# Patient Record
Sex: Female | Born: 1975 | Hispanic: No | Marital: Single | Smoking: Never smoker
Health system: Southern US, Community
[De-identification: ages and names within clinical notes are randomized; demographics above are authoritative.]

## PROBLEM LIST (undated history)

## (undated) DIAGNOSIS — J45909 Unspecified asthma, uncomplicated: Secondary | ICD-10-CM

## (undated) DIAGNOSIS — R531 Weakness: Secondary | ICD-10-CM

## (undated) DIAGNOSIS — R109 Unspecified abdominal pain: Secondary | ICD-10-CM

## (undated) DIAGNOSIS — M79604 Pain in right leg: Secondary | ICD-10-CM

## (undated) DIAGNOSIS — E559 Vitamin D deficiency, unspecified: Secondary | ICD-10-CM

## (undated) DIAGNOSIS — R5383 Other fatigue: Secondary | ICD-10-CM

## (undated) DIAGNOSIS — D649 Anemia, unspecified: Secondary | ICD-10-CM

## (undated) DIAGNOSIS — N946 Dysmenorrhea, unspecified: Secondary | ICD-10-CM

## (undated) DIAGNOSIS — E7800 Pure hypercholesterolemia, unspecified: Secondary | ICD-10-CM

## (undated) DIAGNOSIS — K219 Gastro-esophageal reflux disease without esophagitis: Secondary | ICD-10-CM

## (undated) DIAGNOSIS — R112 Nausea with vomiting, unspecified: Secondary | ICD-10-CM

## (undated) DIAGNOSIS — E782 Mixed hyperlipidemia: Secondary | ICD-10-CM

## (undated) DIAGNOSIS — M791 Myalgia, unspecified site: Secondary | ICD-10-CM

## (undated) DIAGNOSIS — I499 Cardiac arrhythmia, unspecified: Secondary | ICD-10-CM

## (undated) DIAGNOSIS — R0602 Shortness of breath: Secondary | ICD-10-CM

## (undated) DIAGNOSIS — K625 Hemorrhage of anus and rectum: Secondary | ICD-10-CM

## (undated) DIAGNOSIS — Z72820 Sleep deprivation: Secondary | ICD-10-CM

## (undated) DIAGNOSIS — R52 Pain, unspecified: Secondary | ICD-10-CM

## (undated) DIAGNOSIS — G43909 Migraine, unspecified, not intractable, without status migrainosus: Secondary | ICD-10-CM

## (undated) DIAGNOSIS — R4589 Other symptoms and signs involving emotional state: Secondary | ICD-10-CM

## (undated) DIAGNOSIS — R194 Change in bowel habit: Secondary | ICD-10-CM

## (undated) DIAGNOSIS — J301 Allergic rhinitis due to pollen: Secondary | ICD-10-CM

## (undated) HISTORY — DX: Sleep deprivation: Z72.820

## (undated) HISTORY — DX: Dysmenorrhea, unspecified: N94.6

## (undated) HISTORY — PX: BUNIONECTOMY: SHX129

## (undated) HISTORY — DX: Pure hypercholesterolemia, unspecified: E78.00

## (undated) HISTORY — DX: Gastro-esophageal reflux disease without esophagitis: K21.9

## (undated) HISTORY — DX: Change in bowel habit: R19.4

## (undated) HISTORY — DX: Other fatigue: R53.83

## (undated) HISTORY — DX: Vitamin D deficiency, unspecified: E55.9

## (undated) HISTORY — PX: TUBAL LIGATION: SHX77

## (undated) HISTORY — PX: HERNIA REPAIR: SHX51

## (undated) HISTORY — DX: Unspecified abdominal pain: R10.9

## (undated) HISTORY — PX: ABDOMINAL HYSTERECTOMY: SHX81

## (undated) HISTORY — DX: Pain, unspecified: R52

## (undated) HISTORY — DX: Nausea with vomiting, unspecified: R11.2

## (undated) HISTORY — DX: Weakness: R53.1

## (undated) HISTORY — DX: Allergic rhinitis due to pollen: J30.1

## (undated) HISTORY — DX: Myalgia, unspecified site: M79.10

## (undated) HISTORY — DX: Shortness of breath: R06.02

## (undated) HISTORY — DX: Pain in right leg: M79.604

## (undated) HISTORY — DX: Other symptoms and signs involving emotional state: R45.89

## (undated) HISTORY — DX: Mixed hyperlipidemia: E78.2

## (undated) HISTORY — DX: Cardiac arrhythmia, unspecified: I49.9

## (undated) HISTORY — PX: WISDOM TOOTH EXTRACTION: SHX21

---

## 2007-04-26 ENCOUNTER — Emergency Department (HOSPITAL_COMMUNITY): Admission: EM | Admit: 2007-04-26 | Discharge: 2007-04-26 | Payer: Self-pay | Admitting: Emergency Medicine

## 2007-07-06 ENCOUNTER — Emergency Department (HOSPITAL_COMMUNITY): Admission: EM | Admit: 2007-07-06 | Discharge: 2007-07-06 | Payer: Self-pay | Admitting: Emergency Medicine

## 2007-07-07 ENCOUNTER — Ambulatory Visit: Payer: Self-pay | Admitting: Cardiology

## 2007-07-08 ENCOUNTER — Ambulatory Visit: Payer: Self-pay

## 2009-02-22 ENCOUNTER — Emergency Department (HOSPITAL_BASED_OUTPATIENT_CLINIC_OR_DEPARTMENT_OTHER): Admission: EM | Admit: 2009-02-22 | Discharge: 2009-02-22 | Payer: Self-pay | Admitting: Emergency Medicine

## 2011-04-21 NOTE — Assessment & Plan Note (Signed)
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 She has no prior history of heart disease. Over  the last few weeks, she has developed some left sided chest pain, which  she described as sharp and pinching pain. This is not related to  exertion and does not seem to be affected by changes in position or  breathing. Yesterday, the pain was somewhat worse and she had an  electrocardiogram which reported to show some T-wave changes. She was  seen in urgent care here, and an x-ray and another ECD was performed  which they told her was normal. They told her that they thought the pain  was probably related to a problem with one of her ribs. Today at work,  she developed a recurrent chest pain and another ECD was obtained and we  saw her for evaluation. She has had no associated shortness of breath or  palpitations. She has no history of valvular problems.   PAST MEDICAL HISTORY:  Significant for previous asthma. She has had no  surgery. She is currently on no medications.   FAMILY HISTORY:  Negative for hypertension or valvular heart disease.   SOCIAL HISTORY:  She is a CMA at [employer]. She has three  children.   PHYSICAL EXAMINATION:  VITAL SIGNS:  Blood pressure 114/76, pulse 57 and  regular.  NECK:  There is no vein distension. The carotid pulses were full without  bruits.  CHEST:  Clear.  CARDIAC:  Rhythm was regular. The first and second heart sounds were  normal. I could hear no clicks, gallops, or murmurs.  ABDOMEN:  Soft with normal bowel sounds. There is no hepatosplenomegaly.  EXTREMITIES:  The peripheral pulses were full with no peripheral edema.   Her electrocardiogram showed T-wave  inversion in 3, with a flat T-wave  in ABF, and there was some inverted P-wave in lead VI and VIII, but not  VII.   IMPRESSION:  Chest pain and abnormal ECG of uncertain etiology.   RECOMMENDATIONS:  I am really not certain regarding the etiology of  Amber Barrera's pain. Her electrocardiogram is a little bit abnormal. It is  possible that she could have mitral prolapse and symptoms related to  this. Her pain does not sound pericardial. I do not hear a rub. We will  plan to get a 2D echo on her and then decide about further evaluation  after that. Alternatively, her pain may be chest wall pain or  musculoskeletal pain of some type.    Bruce Elvera Lennox Juanda Chance, MD, [facility]  Electronically Signed   BRB/MedQ  DD: 07/07/2007  DT: 07/08/2007  Job #: [id number]

## 2013-02-01 DIAGNOSIS — R102 Pelvic and perineal pain: Secondary | ICD-10-CM | POA: Insufficient documentation

## 2013-05-18 DIAGNOSIS — Z113 Encounter for screening for infections with a predominantly sexual mode of transmission: Secondary | ICD-10-CM | POA: Insufficient documentation

## 2013-09-04 ENCOUNTER — Emergency Department (HOSPITAL_BASED_OUTPATIENT_CLINIC_OR_DEPARTMENT_OTHER)
Admission: EM | Admit: 2013-09-04 | Discharge: 2013-09-05 | Disposition: A | Discharge status: Home / Self Care | Payer: BC Managed Care – PPO | Attending: Emergency Medicine | Admitting: Emergency Medicine

## 2013-09-04 ENCOUNTER — Encounter (HOSPITAL_BASED_OUTPATIENT_CLINIC_OR_DEPARTMENT_OTHER): Payer: Self-pay | Admitting: *Deleted

## 2013-09-04 DIAGNOSIS — J45909 Unspecified asthma, uncomplicated: Secondary | ICD-10-CM | POA: Insufficient documentation

## 2013-09-04 DIAGNOSIS — A088 Other specified intestinal infections: Secondary | ICD-10-CM | POA: Insufficient documentation

## 2013-09-04 DIAGNOSIS — A084 Viral intestinal infection, unspecified: Secondary | ICD-10-CM

## 2013-09-04 DIAGNOSIS — J069 Acute upper respiratory infection, unspecified: Secondary | ICD-10-CM | POA: Insufficient documentation

## 2013-09-04 HISTORY — DX: Unspecified asthma, uncomplicated: J45.909

## 2013-09-04 MED ORDER — KETOROLAC TROMETHAMINE 30 MG/ML IJ SOLN
30.0000 mg | Freq: Once | INTRAMUSCULAR | Status: AC
  Administered 2013-09-05: 30 mg via INTRAVENOUS

## 2013-09-04 MED ORDER — ONDANSETRON HCL 4 MG/2ML IJ SOLN
INTRAMUSCULAR | Status: AC
  Administered 2013-09-05: 4 mg via INTRAVENOUS
  Filled 2013-09-04: qty 2

## 2013-09-04 MED ORDER — KETOROLAC TROMETHAMINE 30 MG/ML IJ SOLN
INTRAMUSCULAR | Status: AC
  Administered 2013-09-05: 30 mg via INTRAVENOUS
  Filled 2013-09-04: qty 1

## 2013-09-04 MED ORDER — SODIUM CHLORIDE 0.9 % IV BOLUS (SEPSIS)
1000.0000 mL | Freq: Once | INTRAVENOUS | Status: AC
  Administered 2013-09-05: 1000 mL via INTRAVENOUS

## 2013-09-04 MED ORDER — ONDANSETRON HCL 4 MG/2ML IJ SOLN
4.0000 mg | Freq: Once | INTRAMUSCULAR | Status: AC
  Administered 2013-09-05: 4 mg via INTRAVENOUS

## 2013-09-04 NOTE — ED Notes (Signed)
Body aches and runny nose x 1 day- seen at doctor today and had dexamethasone and rocephin- states not feeling any better

## 2013-09-04 NOTE — ED Provider Notes (Signed)
CSN: [id number]     Arrival date & time 09/04/13  2235 History   None    Chief Complaint  Patient presents with  . Generalized Body Aches   (Consider location/radiation/quality/duration/timing/severity/associated sxs/prior Treatment) Patient is a 37 y.o. female presenting with diarrhea. The history is provided by the patient. No language interpreter was used.  Diarrhea Quality:  Watery Severity:  Moderate Number of episodes:  5-6 Duration:  1 day Timing:  Intermittent Associated symptoms: fever and URI   Associated symptoms: no vomiting   Fever:    Duration:  1 day   Temp source:  Subjective Risk factors: sick contacts   Risk factors: no suspicious food intake and no travel to endemic areas    Pt is a 37 year old female who presents to the ER with a 1 day history of congestion, runny nose and watery diarrhea. She reports that she is nauseous and aching all over and is not feeling any better. She also reports that her children had a recent URI this past week but didn't have GI symptoms. She denies blood in her stool, vomiting, recent travel or suspicious food intake. Denies headache or neck stiffness.    Past Medical History  Diagnosis Date  . Asthma    Past Surgical History  Procedure Laterality Date  . Tubal ligation    . Bunionectomy     No family history on file. History  Substance Use Topics  . Smoking status: Never Smoker   . Smokeless tobacco: Never Used  . Alcohol Use: 0.6 oz/week    1 Glasses of wine per week   OB History   Grav Para Term Preterm Abortions TAB SAB Ect Mult Living                 Review of Systems  Constitutional: Positive for fever.  HENT: Positive for congestion. Negative for neck stiffness.   Gastrointestinal: Positive for nausea and diarrhea. Negative for vomiting.  Genitourinary: Negative for dysuria and urgency.  Neurological: Negative for weakness and light-headedness.  All other systems reviewed and are negative.    Allergies   Review of patient's allergies indicates no known allergies.  Home Medications  No current outpatient prescriptions on file. BP 102/64  Pulse 94  Temp(Src) 99.8 F (37.7 C) (Oral)  Resp 18  Ht 5\' 2"  (1.575 m)  Wt 158 lb (71.668 kg)  BMI 28.89 kg/m2  SpO2 100%  LMP 08/21/2013 Physical Exam  Nursing note and vitals reviewed. Constitutional: She is oriented to person, place, and time. She appears well-developed and well-nourished. No distress.  HENT:  Head: Normocephalic and atraumatic.  Right Ear: External ear normal.  Left Ear: External ear normal.  Mouth/Throat: Oropharynx is clear and moist.  Eyes: Pupils are equal, round, and reactive to light.  Neck: Normal range of motion. Neck supple. No JVD present. No thyromegaly present.  Cardiovascular: Normal rate, regular rhythm and normal heart sounds.   Pulmonary/Chest: Effort normal and breath sounds normal. No respiratory distress. She has no wheezes.  Abdominal: Soft. Bowel sounds are normal. She exhibits no distension. There is no tenderness. There is no rebound and no guarding.  Musculoskeletal: Normal range of motion.  Lymphadenopathy:    She has no cervical adenopathy.  Neurological: She is alert and oriented to person, place, and time. She has normal strength. No cranial nerve deficit or sensory deficit. She displays a negative Romberg sign. GCS eye subscore is 4. GCS verbal subscore is 5. GCS motor subscore is 6.  Skin: Skin is warm and dry. No rash noted.  Psychiatric: She has a normal mood and affect. Her behavior is normal. Judgment and thought content normal.    ED Course  Procedures (including critical care time) Labs Review Labs Reviewed - No data to display Imaging Review No results found.  MDM   1. Viral gastroenteritis    CBC: WBC 12.5, BMP unremarkable. Some relief after toradol, zofran and 1 liter of fluid. Symptomatic treatment for viral gastroenteritis. Reassuring exam. No abdominal pain, no peritoneal  signs. No menningeal signs.        Irish Elders, NP 09/05/13 0100  Irish Elders, NP 09/05/13 1610

## 2013-09-05 LAB — CBC WITH DIFFERENTIAL/PLATELET
Eosinophils Absolute: 0 10*3/uL (ref 0.0–0.7)
Eosinophils Relative: 0 % (ref 0–5)
Lymphs Abs: 0.8 10*3/uL (ref 0.7–4.0)
MCH: 22.1 pg — ABNORMAL LOW (ref 26.0–34.0)
MCHC: 33 g/dL (ref 30.0–36.0)
MCV: 66.9 fL — ABNORMAL LOW (ref 78.0–100.0)
Monocytes Absolute: 0.8 10*3/uL (ref 0.1–1.0)
Neutrophils Relative %: 88 % — ABNORMAL HIGH (ref 43–77)
Platelets: 233 10*3/uL (ref 150–400)

## 2013-09-05 LAB — BASIC METABOLIC PANEL
CO2: 26 mEq/L (ref 19–32)
Calcium: 9.8 mg/dL (ref 8.4–10.5)
Creatinine, Ser: 0.7 mg/dL (ref 0.50–1.10)
GFR calc non Af Amer: >90 mL/min (ref >90)
Glucose, Bld: 140 mg/dL — ABNORMAL HIGH (ref 70–99)
Sodium: 134 mEq/L — ABNORMAL LOW (ref 135–145)

## 2013-09-05 MED ORDER — ONDANSETRON HCL 4 MG PO TABS: 4.0000 mg | ORAL_TABLET | Freq: Four times a day (QID) | ORAL | Status: DC

## 2013-09-05 NOTE — ED Provider Notes (Signed)
History/physical exam/procedure(s) were performed by non-physician practitioner and as supervising physician I was immediately available for consultation/collaboration. I have reviewed all notes and am in agreement with care and plan.   Hilario Quarry, MD 09/05/13 [phone]

## 2014-07-09 ENCOUNTER — Encounter (HOSPITAL_BASED_OUTPATIENT_CLINIC_OR_DEPARTMENT_OTHER): Payer: Self-pay | Admitting: Emergency Medicine

## 2014-07-09 ENCOUNTER — Emergency Department (HOSPITAL_BASED_OUTPATIENT_CLINIC_OR_DEPARTMENT_OTHER)
Admission: EM | Admit: 2014-07-09 | Discharge: 2014-07-10 | Disposition: A | Discharge status: Home / Self Care | Payer: 59 | Attending: Emergency Medicine | Admitting: Emergency Medicine

## 2014-07-09 ENCOUNTER — Emergency Department (HOSPITAL_BASED_OUTPATIENT_CLINIC_OR_DEPARTMENT_OTHER): Payer: 59

## 2014-07-09 DIAGNOSIS — D259 Leiomyoma of uterus, unspecified: Secondary | ICD-10-CM | POA: Diagnosis not present

## 2014-07-09 DIAGNOSIS — Z3202 Encounter for pregnancy test, result negative: Secondary | ICD-10-CM | POA: Diagnosis not present

## 2014-07-09 DIAGNOSIS — R109 Unspecified abdominal pain: Secondary | ICD-10-CM | POA: Insufficient documentation

## 2014-07-09 DIAGNOSIS — J45909 Unspecified asthma, uncomplicated: Secondary | ICD-10-CM | POA: Diagnosis not present

## 2014-07-09 DIAGNOSIS — R110 Nausea: Secondary | ICD-10-CM | POA: Insufficient documentation

## 2014-07-09 DIAGNOSIS — Z79899 Other long term (current) drug therapy: Secondary | ICD-10-CM | POA: Diagnosis not present

## 2014-07-09 LAB — URINALYSIS, ROUTINE W REFLEX MICROSCOPIC
BILIRUBIN URINE: NEGATIVE
Glucose, UA: NEGATIVE mg/dL
Ketones, ur: NEGATIVE mg/dL
NITRITE: NEGATIVE
PH: 8 (ref 5.0–8.0)
Protein, ur: NEGATIVE mg/dL
SPECIFIC GRAVITY, URINE: 1.027 (ref 1.005–1.030)
Urobilinogen, UA: 1 mg/dL (ref 0.0–1.0)

## 2014-07-09 LAB — URINE MICROSCOPIC-ADD ON

## 2014-07-09 LAB — PREGNANCY, URINE: Preg Test, Ur: NEGATIVE

## 2014-07-09 MED ORDER — ONDANSETRON HCL 4 MG/2ML IJ SOLN
4.0000 mg | Freq: Once | INTRAMUSCULAR | Status: AC
  Administered 2014-07-09: 4 mg via INTRAVENOUS
  Filled 2014-07-09: qty 2

## 2014-07-09 MED ORDER — SODIUM CHLORIDE 0.9 % IV BOLUS (SEPSIS): 1000.0000 mL | Freq: Once | INTRAVENOUS | Status: DC

## 2014-07-09 MED ORDER — HYDROMORPHONE HCL PF 1 MG/ML IJ SOLN
1.0000 mg | Freq: Once | INTRAMUSCULAR | Status: AC
  Administered 2014-07-09: 1 mg via INTRAVENOUS
  Filled 2014-07-09: qty 1

## 2014-07-09 NOTE — ED Notes (Signed)
Abdominal pain since this afternoon. Nausea. Lower abdomen is painful. Worse on lower left. Bowel movement was normal and pain did not improve.

## 2014-07-09 NOTE — ED Provider Notes (Signed)
CSN: [id number]     Arrival date & time 07/09/14  2224 History   First MD Initiated Contact with Patient 07/09/14 2241     Chief Complaint  Patient presents with  . Abdominal Pain     (Consider location/radiation/quality/duration/timing/severity/associated sxs/prior Treatment) HPI  Patient to the ED with complaints of abdominal pains that started at 5 pm this evening. It is severe an located in the LLQ. She started her menstrual cycle today. She has not had any irregular vaginal bleeding or discharge. She feels nauseous but has not had any vomiting or diarrhea. She has a hx of ovarian cysts but says this feels nothing like her previous ovarian cysts. She has had a normal bowel movement today.   Past Medical History  Diagnosis Date  . Asthma    Past Surgical History  Procedure Laterality Date  . Tubal ligation    . Bunionectomy     No family history on file. History  Substance Use Topics  . Smoking status: Never Smoker   . Smokeless tobacco: Never Used  . Alcohol Use: 0.6 oz/week    1 Glasses of wine per week   OB History   Grav Para Term Preterm Abortions TAB SAB Ect Mult Living                 Review of Systems   Review of Systems  Gen: no weight loss, fevers, chills, night sweats  Eyes: no discharge or drainage, no occular pain or visual changes  Nose: no epistaxis or rhinorrhea  Mouth: no dental pain, no sore throat  Neck: no neck pain  Lungs:No wheezing or hemoptysis No coughing CV:  No palpitations, dependent edema or orthopnea. No chest pain Abd: no diarrhea. No nausea or vomiting, + abdominal pain  GU: no dysuria or gross hematuria  MSK:  No muscle weakness, No  pain Neuro: no headache, no focal neurologic deficits  Skin: no rash , no wounds Psyche: no complaints    Allergies  Review of patient's allergies indicates no known allergies.  Home Medications   Prior to Admission medications   Medication Sig Start Date End Date Taking? Authorizing  Provider  HYDROcodone-acetaminophen (NORCO/VICODIN) 5-325 MG per tablet Take 1-2 tablets by mouth every 4 (four) hours as needed. 07/10/14   Thula Stewart Marilu Favre, PA-C  ondansetron (ZOFRAN) 4 MG tablet Take 1 tablet (4 mg total) by mouth every 6 (six) hours. 09/05/13   Elisha Headland, NP  ondansetron (ZOFRAN) 4 MG tablet Take 1 tablet (4 mg total) by mouth every 6 (six) hours. 07/10/14   Lorissa Kishbaugh Marilu Favre, PA-C   BP 114/72  Pulse 94  Temp(Src) 98.3 F (36.8 C) (Oral)  Resp 24  Ht 5' 2.5" (1.588 m)  Wt 165 lb (74.844 kg)  BMI 29.68 kg/m2  SpO2 100%  LMP 07/09/2014 Physical Exam  Nursing note and vitals reviewed. Constitutional: She appears well-developed and well-nourished. No distress.  HENT:  Head: Normocephalic and atraumatic.  Eyes: Pupils are equal, round, and reactive to light.  Neck: Normal range of motion. Neck supple.  Cardiovascular: Normal rate and regular rhythm.   Pulmonary/Chest: Effort normal.  Abdominal: Soft. Bowel sounds are normal. She exhibits no distension, no fluid wave and no ascites. There is no hepatomegaly. There is tenderness in the left lower quadrant. There is guarding. There is no rebound and no CVA tenderness.    Neurological: She is alert.  Skin: Skin is warm and dry.    ED Course  Procedures (including critical  care time) Labs Review Labs Reviewed  URINALYSIS, ROUTINE W REFLEX MICROSCOPIC - Abnormal; Notable for the following:    APPearance CLOUDY (*)    Hgb urine dipstick LARGE (*)    Leukocytes, UA SMALL (*)    All other components within normal limits  URINE MICROSCOPIC-ADD ON - Abnormal; Notable for the following:    Squamous Epithelial / LPF FEW (*)    Bacteria, UA FEW (*)    All other components within normal limits  CBC WITH DIFFERENTIAL - Abnormal; Notable for the following:    Hemoglobin 10.7 (*)    HCT 32.8 (*)    MCV 68.8 (*)    MCH 22.4 (*)    All other components within normal limits  BASIC METABOLIC PANEL - Abnormal; Notable for the  following:    Glucose, Bld 142 (*)    All other components within normal limits  WET PREP, GENITAL  GC/CHLAMYDIA PROBE AMP  PREGNANCY, URINE    Imaging Review Ct Abdomen Pelvis W Contrast  07/10/2014   CLINICAL DATA:  Left lower quadrant abdominal pain  EXAM: CT ABDOMEN AND PELVIS WITH CONTRAST  TECHNIQUE: Multidetector CT imaging of the abdomen and pelvis was performed using the standard protocol following bolus administration of intravenous contrast.  CONTRAST:  82mL OMNIPAQUE IOHEXOL 300 MG/ML SOLN, 177mL OMNIPAQUE IOHEXOL 300 MG/ML SOLN  COMPARISON:  None.  FINDINGS: BODY WALL: Unremarkable.  LOWER CHEST: Unremarkable.  ABDOMEN/PELVIS:  Liver: No focal abnormality.  Biliary: No evidence of biliary obstruction or stone.  Pancreas: Unremarkable.  Spleen: Unremarkable.  Adrenals: Unremarkable.  Kidneys and ureters: No hydronephrosis or stone.  Bladder: Unremarkable.  Reproductive: 3.7 cm intramural mass within the anterior lower uterine segment consistent with fibroid. 3 cm simple appearing cyst from the right ovary, likely follicular.  Bowel: No obstruction. Normal appendix.  Retroperitoneum: No mass or adenopathy.  Peritoneum: Small volume free pelvic fluid, likely physiologic.  Vascular: No acute abnormality.  OSSEOUS: No acute abnormalities.  IMPRESSION: 1. No acute intra-abdominal findings. 2. 3.7 cm uterine fibroid.   Electronically Signed   By: Jorje Guild M.D.   On: 07/10/2014 00:59     EKG Interpretation None      MDM   Final diagnoses:  Uterine leiomyoma, unspecified location    Pt refuses pelvic exam.  Blood work is reassuring, not pregnant. CT scan of the abdomen shows that he has a uterine fibroid, she just started her menstrual cycle today and that is when her pain became severe. She does not want to do a pelvic because her menstual flow is so heavy. She see's a Editor, commissioning at [facility], saw him earlier last night and had a normal exam. She has been advised to  see him sometime this week for follow-up and repeat pelvic.  ondansetron (ZOFRAN) 4 MG tablet Take 1 tablet (4 mg total) by mouth every 6 (six) hours. 12 tablet Linus Mako, PA-C      HYDROcodone-acetaminophen (NORCO/VICODIN) 5-325 MG per tablet Take 1-2 tablets by mouth every 4 (four) hours as needed. 20 tablet Linus Mako, PA-C   PATIENT MADE AWARE THAT WITHOUT PELVIC, INFECTIOUS CAUSES OF PAIN CAN NOT BE RULED OUT. DECLINED PELVIC X 2  37 y.o.Amber Barrera's evaluation in the Emergency Department is complete. It has been determined that no acute conditions requiring further emergency intervention are present at this time. The patient/guardian have been advised of the diagnosis and plan. We have discussed signs and symptoms that warrant return to the  ED, such as changes or worsening in symptoms.  Vital signs are stable at discharge. Filed Vitals:   07/09/14 2231  BP: 114/72  Pulse: 94  Temp: 98.3 F (36.8 C)  Resp: 24    Patient/guardian has voiced understanding and agreed to follow-up with the PCP or specialist.     Linus Mako, PA-C 07/10/14 1505

## 2014-07-10 DIAGNOSIS — D259 Leiomyoma of uterus, unspecified: Secondary | ICD-10-CM | POA: Diagnosis not present

## 2014-07-10 LAB — CBC WITH DIFFERENTIAL/PLATELET
BASOS ABS: 0 10*3/uL (ref 0.0–0.1)
Basophils Relative: 0 % (ref 0–1)
EOS ABS: 0.2 10*3/uL (ref 0.0–0.7)
EOS PCT: 2 % (ref 0–5)
HCT: 32.8 % — ABNORMAL LOW (ref 36.0–46.0)
Hemoglobin: 10.7 g/dL — ABNORMAL LOW (ref 12.0–15.0)
Lymphocytes Relative: 14 % (ref 12–46)
Lymphs Abs: 1.4 10*3/uL (ref 0.7–4.0)
MCH: 22.4 pg — AB (ref 26.0–34.0)
MCHC: 32.6 g/dL (ref 30.0–36.0)
MCV: 68.8 fL — AB (ref 78.0–100.0)
Monocytes Absolute: 0.7 10*3/uL (ref 0.1–1.0)
Monocytes Relative: 7 % (ref 3–12)
Neutro Abs: 7.4 10*3/uL (ref 1.7–7.7)
Neutrophils Relative %: 76 % (ref 43–77)
PLATELETS: 240 10*3/uL (ref 150–400)
RBC: 4.77 MIL/uL (ref 3.87–5.11)
RDW: 14.3 % (ref 11.5–15.5)
WBC: 9.8 10*3/uL (ref 4.0–10.5)

## 2014-07-10 LAB — BASIC METABOLIC PANEL
Anion gap: 11 (ref 5–15)
BUN: 11 mg/dL (ref 6–23)
CALCIUM: 9.5 mg/dL (ref 8.4–10.5)
CO2: 25 mEq/L (ref 19–32)
Chloride: 102 mEq/L (ref 96–112)
Creatinine, Ser: 0.7 mg/dL (ref 0.50–1.10)
GFR calc Af Amer: >90 mL/min (ref >90)
GLUCOSE: 142 mg/dL — AB (ref 70–99)
Potassium: 3.8 mEq/L (ref 3.7–5.3)
Sodium: 138 mEq/L (ref 137–147)

## 2014-07-10 MED ORDER — ONDANSETRON HCL 4 MG PO TABS: 4.0000 mg | ORAL_TABLET | Freq: Four times a day (QID) | ORAL | Status: DC

## 2014-07-10 MED ORDER — OXYCODONE-ACETAMINOPHEN 5-325 MG PO TABS
1.0000 | ORAL_TABLET | Freq: Once | ORAL | Status: AC
  Administered 2014-07-10: 1 via ORAL
  Filled 2014-07-10: qty 1

## 2014-07-10 MED ORDER — IOHEXOL 300 MG/ML  SOLN
100.0000 mL | Freq: Once | INTRAMUSCULAR | Status: AC | PRN
  Administered 2014-07-10: 100 mL via INTRAVENOUS

## 2014-07-10 MED ORDER — ONDANSETRON 4 MG PO TBDP
4.0000 mg | ORAL_TABLET | Freq: Once | ORAL | Status: AC
  Administered 2014-07-10: 4 mg via ORAL
  Filled 2014-07-10: qty 1

## 2014-07-10 MED ORDER — IOHEXOL 300 MG/ML  SOLN
50.0000 mL | Freq: Once | INTRAMUSCULAR | Status: AC | PRN
  Administered 2014-07-09: 50 mL via ORAL

## 2014-07-10 MED ORDER — HYDROCODONE-ACETAMINOPHEN 5-325 MG PO TABS: 1.0000 | ORAL_TABLET | ORAL | Status: DC | PRN

## 2014-07-10 NOTE — ED Provider Notes (Signed)
Medical screening examination/treatment/procedure(s) were performed by non-physician practitioner and as supervising physician I was immediately available for consultation/collaboration.   EKG Interpretation None      \  [place], DO 07/10/14 2305

## 2014-10-07 ENCOUNTER — Emergency Department (HOSPITAL_BASED_OUTPATIENT_CLINIC_OR_DEPARTMENT_OTHER)
Admission: EM | Admit: 2014-10-07 | Discharge: 2014-10-08 | Disposition: A | Discharge status: Home / Self Care | Payer: 59 | Attending: Emergency Medicine | Admitting: Emergency Medicine

## 2014-10-07 ENCOUNTER — Encounter (HOSPITAL_BASED_OUTPATIENT_CLINIC_OR_DEPARTMENT_OTHER): Payer: Self-pay

## 2014-10-07 DIAGNOSIS — R110 Nausea: Secondary | ICD-10-CM | POA: Diagnosis not present

## 2014-10-07 DIAGNOSIS — Z91040 Latex allergy status: Secondary | ICD-10-CM | POA: Diagnosis not present

## 2014-10-07 DIAGNOSIS — Z8679 Personal history of other diseases of the circulatory system: Secondary | ICD-10-CM | POA: Diagnosis not present

## 2014-10-07 DIAGNOSIS — R51 Headache: Secondary | ICD-10-CM | POA: Insufficient documentation

## 2014-10-07 DIAGNOSIS — H53149 Visual discomfort, unspecified: Secondary | ICD-10-CM | POA: Insufficient documentation

## 2014-10-07 DIAGNOSIS — J45909 Unspecified asthma, uncomplicated: Secondary | ICD-10-CM | POA: Insufficient documentation

## 2014-10-07 DIAGNOSIS — R519 Headache, unspecified: Secondary | ICD-10-CM

## 2014-10-07 DIAGNOSIS — Z79899 Other long term (current) drug therapy: Secondary | ICD-10-CM | POA: Insufficient documentation

## 2014-10-07 DIAGNOSIS — Z720 Tobacco use: Secondary | ICD-10-CM | POA: Diagnosis not present

## 2014-10-07 HISTORY — DX: Migraine, unspecified, not intractable, without status migrainosus: G43.909

## 2014-10-07 MED ORDER — DIPHENHYDRAMINE HCL 50 MG/ML IJ SOLN
25.0000 mg | Freq: Once | INTRAMUSCULAR | Status: AC
  Administered 2014-10-07: 25 mg via INTRAVENOUS
  Filled 2014-10-07: qty 1

## 2014-10-07 MED ORDER — METOCLOPRAMIDE HCL 5 MG/ML IJ SOLN
10.0000 mg | Freq: Once | INTRAMUSCULAR | Status: AC
  Administered 2014-10-07: 10 mg via INTRAVENOUS
  Filled 2014-10-07: qty 2

## 2014-10-07 MED ORDER — KETOROLAC TROMETHAMINE 30 MG/ML IJ SOLN
30.0000 mg | Freq: Once | INTRAMUSCULAR | Status: AC
  Administered 2014-10-07: 30 mg via INTRAVENOUS
  Filled 2014-10-07: qty 1

## 2014-10-07 MED ORDER — SODIUM CHLORIDE 0.9 % IV BOLUS (SEPSIS)
1000.0000 mL | Freq: Once | INTRAVENOUS | Status: AC
  Administered 2014-10-07: 1000 mL via INTRAVENOUS

## 2014-10-07 NOTE — ED Provider Notes (Signed)
CSN: [id number]     Arrival date & time 10/07/14  2213 History  This chart was scribed for Wynetta Fines, MD by Tula Nakayama, ED Scribe. This patient was seen in room MH09/MH09 and the patient's care was started at 11:31 PM.    Chief Complaint  Patient presents with  . Headache   The history is provided by the patient. No language interpreter was used.    HPI Comments: Amber Barrera is a 38 y.o. female who presents to the Emergency Department complaining of a  headache that started 24 hours ago. She describes it as a generalized pressure plus a sharper pain behind her left eye that radiates to her left ear. She has associated photophobia and nausea but no vomiting. Pt was drinking white wine at a Halloween party when symptoms started. She has tried rest and hydrocodone with no relief. Her pain worsened this evening and is now severe. She describes her symptoms as similar to previous migraines. She denies visual changes or focal neurologic changes.  Past Medical History  Diagnosis Date  . Asthma   . Migraines    Past Surgical History  Procedure Laterality Date  . Tubal ligation    . Bunionectomy     No family history on file. History  Substance Use Topics  . Smoking status: Current Some Day Smoker  . Smokeless tobacco: Never Used  . Alcohol Use: 0.6 oz/week    1 Glasses of wine per week   OB History    No data available     Review of Systems  10 Systems reviewed and all are negative for acute change except as noted in the HPI.  Allergies  Latex  Home Medications   Prior to Admission medications   Medication Sig Start Date End Date Taking? Authorizing Provider  HYDROcodone-acetaminophen (NORCO/VICODIN) 5-325 MG per tablet Take 1-2 tablets by mouth every 4 (four) hours as needed. 07/10/14   Tiffany Marilu Favre, PA-C  ondansetron (ZOFRAN) 4 MG tablet Take 1 tablet (4 mg total) by mouth every 6 (six) hours. 09/05/13   Elisha Headland, NP  ondansetron (ZOFRAN) 4 MG tablet Take 1  tablet (4 mg total) by mouth every 6 (six) hours. 07/10/14   Tiffany Marilu Favre, PA-C   BP 100/59 mmHg  Pulse 82  Temp(Src) 98.2 F (36.8 C) (Oral)  Resp 20  Ht 5\' 2"  (1.575 m)  Wt 165 lb (74.844 kg)  BMI 30.17 kg/m2  SpO2 98%  LMP 09/07/2014   Physical Exam  Nursing note and vitals reviewed.  General: Well-developed, well-nourished female in no acute distress; appearance consistent with age of record HENT: normocephalic; atraumatic Eyes: pupils equal, round and reactive to light; extraocular muscles intact; photophobia Neck: supple Heart: regular rate and rhythm Lungs: clear to auscultation bilaterally Abdomen: soft; nondistended; nontender; no masses or hepatosplenomegaly; bowel sounds present Extremities: No deformity; full range of motion; pulses normal Neurologic: Awake, alert and oriented; motor function intact in all extremities and symmetric; no facial droop Skin: Warm and dry Psychiatric: Flat affect   ED Course  Procedures (including critical care time) DIAGNOSTIC STUDIES: Oxygen Saturation is 98% on RA, normal by my interpretation.    COORDINATION OF CARE: 11:38 PM Discussed treatment plan with pt at bedside and pt agreed to plan.   MDM  12:28 AM Patient feeling much better after IV fluids and medications. She has some transient akathisia from the Reglan but this is improving.  I personally performed the services described in this documentation,  which was scribed in my presence. The recorded information has been reviewed and is accurate.   Wynetta Fines, MD 10/08/14 [phone]

## 2014-10-07 NOTE — ED Notes (Signed)
Pt reports headache that will not go away.  Pt reports having a hx of migraines. Reports took hydrocodone but has not relieved pain. Reports pain is behind right eye into right ear and neck.

## 2014-10-08 NOTE — ED Notes (Signed)
Patient stated her BP of 89/54 was her "norm". Nurse was notified by patient of same.

## 2014-10-30 DIAGNOSIS — G5620 Lesion of ulnar nerve, unspecified upper limb: Secondary | ICD-10-CM | POA: Insufficient documentation

## 2014-10-30 DIAGNOSIS — M25512 Pain in left shoulder: Secondary | ICD-10-CM | POA: Insufficient documentation

## 2015-02-13 ENCOUNTER — Encounter (HOSPITAL_BASED_OUTPATIENT_CLINIC_OR_DEPARTMENT_OTHER): Payer: Self-pay | Admitting: Emergency Medicine

## 2015-02-13 ENCOUNTER — Emergency Department (HOSPITAL_BASED_OUTPATIENT_CLINIC_OR_DEPARTMENT_OTHER)
Admission: EM | Admit: 2015-02-13 | Discharge: 2015-02-13 | Disposition: A | Discharge status: Home / Self Care | Payer: 59 | Attending: Emergency Medicine | Admitting: Emergency Medicine

## 2015-02-13 DIAGNOSIS — Z91040 Latex allergy status: Secondary | ICD-10-CM | POA: Insufficient documentation

## 2015-02-13 DIAGNOSIS — K602 Anal fissure, unspecified: Secondary | ICD-10-CM | POA: Diagnosis not present

## 2015-02-13 DIAGNOSIS — Z720 Tobacco use: Secondary | ICD-10-CM | POA: Insufficient documentation

## 2015-02-13 DIAGNOSIS — Z8679 Personal history of other diseases of the circulatory system: Secondary | ICD-10-CM | POA: Diagnosis not present

## 2015-02-13 DIAGNOSIS — K625 Hemorrhage of anus and rectum: Secondary | ICD-10-CM | POA: Diagnosis present

## 2015-02-13 DIAGNOSIS — J45909 Unspecified asthma, uncomplicated: Secondary | ICD-10-CM | POA: Diagnosis not present

## 2015-02-13 HISTORY — DX: Hemorrhage of anus and rectum: K62.5

## 2015-02-13 MED ORDER — LIDOCAINE HCL 2 % EX GEL
CUTANEOUS | Status: AC
  Administered 2015-02-13: 20 via TOPICAL
  Filled 2015-02-13: qty 20

## 2015-02-13 MED ORDER — LIDOCAINE 5 % EX OINT: TOPICAL_OINTMENT | CUTANEOUS | Status: DC

## 2015-02-13 MED ORDER — LIDOCAINE HCL 2 % EX GEL
1.0000 "application " | Freq: Once | CUTANEOUS | Status: AC
  Administered 2015-02-13: 20 via TOPICAL

## 2015-02-13 NOTE — ED Provider Notes (Signed)
CSN: [id number]     Arrival date & time 02/13/15  0014 History   First MD Initiated Contact with Patient 02/13/15 [phone]     Chief Complaint  Patient presents with  . Rectal Bleeding     (Consider location/radiation/quality/duration/timing/severity/associated sxs/prior Treatment) HPI  This is a 39 year old female who has had a six-month history of episodic rectal bleeding. She has been evaluated by gastroenterology without a diagnosis. She is here with rectal bleeding that occurred about a week ago and recurred just prior to arrival. The bleeding is described as blood in the toilet bowl and on the toilet tissue. Her stool is brown without melena. She is having some mild rectal discomfort but no frank pain. She is also having some pain in her right groin fold radiating down her right leg.  Past Medical History  Diagnosis Date  . Asthma   . Migraines   . Rectal bleeding    Past Surgical History  Procedure Laterality Date  . Tubal ligation    . Bunionectomy     History reviewed. No pertinent family history. History  Substance Use Topics  . Smoking status: Current Some Day Smoker  . Smokeless tobacco: Never Used  . Alcohol Use: 0.6 oz/week    1 Glasses of wine per week   OB History    No data available     Review of Systems  All other systems reviewed and are negative.   Allergies  Latex and Reglan  Home Medications   Prior to Admission medications   Medication Sig Start Date End Date Taking? Authorizing Provider  HYDROcodone-acetaminophen (NORCO/VICODIN) 5-325 MG per tablet Take 1-2 tablets by mouth every 4 (four) hours as needed. 07/10/14   Tiffany Carlota Raspberry, PA-C  ondansetron (ZOFRAN) 4 MG tablet Take 1 tablet (4 mg total) by mouth every 6 (six) hours. 09/05/13   Elisha Headland, NP  ondansetron (ZOFRAN) 4 MG tablet Take 1 tablet (4 mg total) by mouth every 6 (six) hours. 07/10/14   Tiffany Carlota Raspberry, PA-C   BP 104/76 mmHg  Pulse 77  Temp(Src) 98 F (36.7 C) (Oral)  Resp 18  Ht  5\' 2"  (1.575 m)  Wt 168 lb (76.204 kg)  BMI 30.72 kg/m2  SpO2 100%  LMP 02/08/2015   Physical Exam  General: Well-developed, well-nourished female in no acute distress; appearance consistent with age of record HENT: normocephalic; atraumatic Eyes: pupils equal, round and reactive to light; extraocular muscles intact Neck: supple Heart: regular rate and rhythm Lungs: clear to auscultation bilaterally Abdomen: soft; nondistended; nontender; no masses or hepatosplenomegaly; bowel sounds present Rectal: External hemorrhoids without thrombosis or engorgement; tender, bleeding posterior midline fissure Extremities: No deformity; full range of motion; pulses normal; mild right groin tenderness Neurologic: Awake, alert and oriented; motor function intact in all extremities and symmetric; no facial droop Skin: Warm and dry Psychiatric: Normal mood and affect    ED Course  Procedures (including critical care time)   MDM  Will refer to general surgery.   Shanon Rosser, MD 02/13/15 [phone]

## 2015-02-13 NOTE — Discharge Instructions (Signed)
Anal Fissure, Adult An anal fissure is a small tear or crack in the skin around the anus. Bleeding from a fissure usually stops on its own within a few minutes. However, bleeding will often reoccur with each bowel movement until the crack heals.  CAUSES   Passing large, hard stools.  Frequent diarrheal stools.  Constipation.  Inflammatory bowel disease (Crohn's disease or ulcerative colitis).  Infections.  Anal sex. SYMPTOMS   Small amounts of blood seen on your stools, on toilet paper, or in the toilet after a bowel movement.  Rectal bleeding.  Painful bowel movements.  Itching or irritation around the anus. DIAGNOSIS Your caregiver will examine the anal area. An anal fissure can usually be seen with careful inspection. A rectal exam may be performed and a short tube (anoscope) may be used to examine the anal canal. TREATMENT   You may be instructed to take fiber supplements. These supplements can soften your stool to help make bowel movements easier.  Sitz baths may be recommended to help heal the tear. Do not use soap in the sitz baths.  A medicated cream or ointment may be prescribed to lessen discomfort. HOME CARE INSTRUCTIONS   Maintain a diet high in fruits, whole grains, and vegetables. Avoid constipating foods like bananas and dairy products.  Take sitz baths as directed by your caregiver.  Drink enough fluids to keep your urine clear or pale yellow.  Only take over-the-counter or prescription medicines for pain, discomfort, or fever as directed by your caregiver. Do not take aspirin as this may increase bleeding.  Do not use ointments containing numbing medications (anesthetics) or hydrocortisone. They could slow healing. SEEK MEDICAL CARE IF:   You have uncontrolled bleeding.  Your problem is getting worse rather than better. MAKE SURE YOU:   Understand these instructions.  Will watch your condition.  Will get help right away if you are not doing  well or get worse. Document Released: 11/23/2005 Document Revised: 02/15/2012 Document Reviewed: 05/10/2011 [facility] Patient Information 2015 [place], [place]. This information is not intended to replace advice given to you by your health care provider. Make sure you discuss any questions you have with your health care provider.

## 2015-02-13 NOTE — ED Notes (Signed)
Pt states that she has been seen for recurring rectal bleeding and tonight it has started back again

## 2015-06-19 ENCOUNTER — Other Ambulatory Visit: Payer: Self-pay | Admitting: General Surgery

## 2015-06-19 NOTE — H&P (Signed)
History of Present Illness Leighton Ruff MD; 06/25/9469 9:42 AM) Patient words: fissure.  The patient is a 39 year old female who presents with a complaint of Rectal bleeding. History: Patient presented self referred due to rectal bleeding. This had been ocurring for 6 months. It was associated with pain during BM's. She developed severe bleeding several days ago and was seen at urgent care. They felt that she had an anal fissure and prescribed her lidocaine ointment for this pain. She has been using this as well as increasing her fiber and water intake. She has had less bleeding but it is not resolved. She does not have a family history significant for inflammatory bowel disease but does have a first cousin who died of colon cancer at age 57.  We were unable to do an anoscopic exam last time due to pain. Her pain has resolved but she continues to have bleeding in her stool and when she wipes. Her bowel movements are more regular after adding fiber to her diet. Problem List/Past Medical Leighton Ruff, MD; 9/62/8366 9:47 AM) RECTAL BLEEDING (569.3  K62.5)  Other Problems Leighton Ruff, MD; 2/94/7654 9:47 AM) Hemorrhoids Other disease, cancer, significant illness Asthma Gastroesophageal Reflux Disease  Past Surgical History Leighton Ruff, MD; 6/50/3546 9:43 AM) No pertinent past surgical history  Diagnostic Studies History Leighton Ruff, MD; 5/68/1275 9:43 AM) Pap Smear 1-5 years ago Colonoscopy 1-5 years ago Mammogram 1-3 years ago  Allergies Leighton Ruff, MD; 1/70/0174 9:43 AM) Latex Exam Gloves *MEDICAL DEVICES AND SUPPLIES* Reglan *GASTROINTESTINAL AGENTS - MISC.*  Medication History Mammie Lorenzo, LPN; 9/44/9675 9:16 AM) Nortriptyline HCl (25MG  Capsule, Oral) Active. Diclofenac Sodium (50MG  Tablet DR, Oral) Active. Vitamin D (Ergocalciferol) (50000UNIT Capsule, Oral once a week) Active. Magnesium (100MG  Capsule, Oral) Active. Iron (Ferrous  Gluconate) (325MG  Tablet, Oral) Active. Zofran (4MG  Tablet, Oral s) Active. Medications Reconciled  Pregnancy / Birth History Leighton Ruff, MD; 3/84/6659 9:43 AM) Maternal age 22-25 Para 9 Age at menarche 61 years. Gravida 4 Regular periods    Vitals Claiborne Billings Dockery LPN; 9/35/7017 7:93 AM) 06/19/2015 9:30 AM Weight: 170.4 lb Height: 62in Body Surface Area: 1.84 m Body Mass Index: 31.17 kg/m Temp.: 98.55F(Oral)  Pulse: 88 (Regular)  BP: 116/74 (Sitting, Left Arm, Standard)     Physical Exam Leighton Ruff MD; 08/10/91 9:47 AM)  General Mental Status-Alert. General Appearance-Consistent with stated age. Hydration-Well hydrated. Voice-Normal.  Head and Neck Head-normocephalic, atraumatic with no lesions or palpable masses. Trachea-midline. Thyroid Gland Characteristics - normal size and consistency.  Eye Eyeball - Bilateral-Extraocular movements intact. Sclera/Conjunctiva - Bilateral-No scleral icterus.  Chest and Lung Exam Chest and lung exam reveals -quiet, even and easy respiratory effort with no use of accessory muscles and on auscultation, normal breath sounds, no adventitious sounds and normal vocal resonance. Inspection Chest Wall - Normal. Back - normal.  Cardiovascular Cardiovascular examination reveals -normal heart sounds, regular rate and rhythm with no murmurs and normal pedal pulses bilaterally.  Abdomen Inspection Inspection of the abdomen reveals - No Hernias. Palpation/Percussion Palpation and Percussion of the abdomen reveal - Soft, Non Tender, No Rebound tenderness, No Rigidity (guarding) and No hepatosplenomegaly.  Rectal Anorectal Exam External - normal external exam. Internal - normal internal exam. Note: Normal sphincter tone. No palpable masses. Proctoscopic exam -Note:See procedure note.   Neurologic Neurologic evaluation reveals -alert and oriented x 3 with no impairment of recent or  remote memory. Mental Status-Normal.  Musculoskeletal Global Assessment -Note:no gross deformities.  Normal Exam - Left-Upper Extremity Strength Normal  and Lower Extremity Strength Normal. Normal Exam - Right-Upper Extremity Strength Normal and Lower Extremity Strength Normal.    Assessment & Plan Leighton Ruff MD; 1/44/8185 9:46 AM)  RECTAL BLEEDING (569.3  K62.5) Story: I recommend that he undergo a colonoscopy to evaluate your bleeding more thoroughly. We will call you to schedule this in the next few days. Impression: 39 year old female with a positive family history of colon cancer at a young age. She has rectal bleeding that does not appear to be from an anal source on anoscopy. I have recommended that she undergo a colonoscopy to evaluate this further. Bowel prep instructions were given. We will schedule this as soon as possible. Risks of the procedure including bleeding and possible perforation.

## 2015-07-08 ENCOUNTER — Ambulatory Visit (HOSPITAL_COMMUNITY): Payer: 59 | Admitting: Anesthesiology

## 2015-07-08 ENCOUNTER — Encounter (HOSPITAL_COMMUNITY): Payer: Self-pay | Admitting: *Deleted

## 2015-07-08 ENCOUNTER — Ambulatory Visit (HOSPITAL_COMMUNITY)
Admission: RE | Admit: 2015-07-08 | Discharge: 2015-07-08 | Disposition: A | Discharge status: Home / Self Care | Payer: 59 | Source: Ambulatory Visit | Attending: General Surgery | Admitting: General Surgery

## 2015-07-08 ENCOUNTER — Encounter (HOSPITAL_COMMUNITY)
Admission: RE | Disposition: A | Discharge status: Home / Self Care | Payer: Self-pay | Source: Ambulatory Visit | Attending: General Surgery

## 2015-07-08 DIAGNOSIS — Z800 Family history of malignant neoplasm of digestive organs: Secondary | ICD-10-CM | POA: Diagnosis not present

## 2015-07-08 DIAGNOSIS — K649 Unspecified hemorrhoids: Secondary | ICD-10-CM | POA: Insufficient documentation

## 2015-07-08 DIAGNOSIS — Z79899 Other long term (current) drug therapy: Secondary | ICD-10-CM | POA: Insufficient documentation

## 2015-07-08 DIAGNOSIS — E669 Obesity, unspecified: Secondary | ICD-10-CM | POA: Diagnosis not present

## 2015-07-08 DIAGNOSIS — Z6831 Body mass index (BMI) 31.0-31.9, adult: Secondary | ICD-10-CM | POA: Insufficient documentation

## 2015-07-08 DIAGNOSIS — F17200 Nicotine dependence, unspecified, uncomplicated: Secondary | ICD-10-CM | POA: Insufficient documentation

## 2015-07-08 DIAGNOSIS — J45909 Unspecified asthma, uncomplicated: Secondary | ICD-10-CM | POA: Diagnosis not present

## 2015-07-08 DIAGNOSIS — R51 Headache: Secondary | ICD-10-CM | POA: Insufficient documentation

## 2015-07-08 DIAGNOSIS — K625 Hemorrhage of anus and rectum: Secondary | ICD-10-CM | POA: Diagnosis not present

## 2015-07-08 DIAGNOSIS — K219 Gastro-esophageal reflux disease without esophagitis: Secondary | ICD-10-CM | POA: Insufficient documentation

## 2015-07-08 DIAGNOSIS — D649 Anemia, unspecified: Secondary | ICD-10-CM | POA: Diagnosis not present

## 2015-07-08 HISTORY — PX: COLONOSCOPY: SHX5424

## 2015-07-08 HISTORY — DX: Anemia, unspecified: D64.9

## 2015-07-08 SURGERY — COLONOSCOPY
Anesthesia: Monitor Anesthesia Care

## 2015-07-08 MED ORDER — PROPOFOL 10 MG/ML IV BOLUS
INTRAVENOUS | Status: AC
  Filled 2015-07-08: qty 20

## 2015-07-08 MED ORDER — PROPOFOL 10 MG/ML IV BOLUS
INTRAVENOUS | Status: DC | PRN
  Administered 2015-07-08: 50 mg via INTRAVENOUS

## 2015-07-08 MED ORDER — SODIUM CHLORIDE 0.9 % IV SOLN
INTRAVENOUS | Status: DC
  Administered 2015-07-08: 12:00:00 via INTRAVENOUS

## 2015-07-08 MED ORDER — PROPOFOL INFUSION 10 MG/ML OPTIME
INTRAVENOUS | Status: DC | PRN
  Administered 2015-07-08: 120 ug/kg/min via INTRAVENOUS

## 2015-07-08 MED ORDER — LIDOCAINE HCL (CARDIAC) 20 MG/ML IV SOLN
INTRAVENOUS | Status: AC
  Filled 2015-07-08: qty 5

## 2015-07-08 MED ORDER — LIDOCAINE HCL (CARDIAC) 20 MG/ML IV SOLN
INTRAVENOUS | Status: DC | PRN
  Administered 2015-07-08: 50 mg via INTRAVENOUS

## 2015-07-08 NOTE — Interval H&P Note (Signed)
History and Physical Interval Note:  07/08/2015 12:01 PM  [name]  has presented today for surgery, with the diagnosis of rectal bleeding  The various methods of treatment have been discussed with the patient and family. After consideration of risks, benefits and other options for treatment, the patient has consented to  Procedure(s): DIAGNOSTIC COLONOSCOPY (N/A) as a surgical intervention .  The patient's history has been reviewed, patient examined, no change in status, stable for surgery.  I have reviewed the patient's chart and labs.  Questions were answered to the patient's satisfaction.     Rosario Adie, MD  [facility]

## 2015-07-08 NOTE — Discharge Instructions (Signed)
Post Colonoscopy Instructions  1. DIET: Follow a light bland diet the first 24 hours after arrival home, such as soup, liquids, crackers, etc.  Be sure to include lots of fluids daily.  Avoid fast food or heavy meals as your are more likely to get nauseated.   2. You may have some mild rectal bleeding for the first few days after the procedure.  This should get less and less with time.  Resume any blood thinners 2 days after your procedure unless directed otherwise by your physician. 3. Take your usually prescribed home medications unless otherwise directed. a. If you have any pain, it is helpful to get up and walk around, as it is usually from excess gas. b. If this is not helpful, you can take an over-the-counter pain medication.  Choose one of the following that works best for you: i. Naproxen (Aleve, etc)  Two 220mg  tabs twice a day ii. Ibuprofen (Advil, etc) Three 200mg  tabs four times a day (every meal & bedtime) iii. If you still have pain after using one of these, please call the office 4. It is normal to not have a bowel movement for 2-3 days after colonoscopy.    5. ACTIVITIES as tolerated:   6. You may resume regular (light) daily activities beginning the next day--such as daily self-care, walking, climbing stairs--gradually increasing activities as tolerated.    WHEN TO CALL us [phone]: 1. Fever over 101.5 F (38.5 C)  2. Severe abdominal or chest pain  3. Large amount of rectal bleeding, passing multiple blood clots  4. Dizziness or shortness of breath 5. Increasing nausea or vomiting   The clinic staff is available to answer your questions during regular business hours (8:30am-5pm).  Please dont hesitate to call and ask to speak to one of our nurses for clinical concerns.   If you have a medical emergency, go to the nearest emergency room or call 911.  A surgeon from [facility] is always on call at the [facility], [address] [place], [place], [place], [place]  [address] ? MAIN: [phone] ? TOLL FREE: [phone] ?  FAX [phone] [email or web address]          GETTING TO GOOD BOWEL HEALTH. Irregular bowel habits such as constipation can lead to many problems over time.  Having one soft bowel movement a day is the most important way to prevent further problems.  The anorectal canal is designed to handle stretching and feces to safely manage our ability to get rid of solid waste (feces, poop, stool) out of our body.  BUT, hard constipated stools can act like ripping concrete bricks causing inflamed hemorrhoids, anal fissures, abdominal pain and bloating.     The goal: ONE SOFT BOWEL MOVEMENT A DAY!  To have soft, regular bowel movements:   Drink at least 8 tall glasses of water a day.    Take plenty of fiber.  Fiber is the undigested part of plant food that passes into the colon, acting s natures broom to encourage bowel motility and movement.  Fiber can absorb and hold large amounts of water. This results in a larger, bulkier stool, which is soft and easier to pass. Work gradually over several weeks up to 6 servings a day of fiber (25g a day even more if needed) in the form of: o Vegetables -- Root (potatoes, carrots, turnips), leafy green (lettuce, salad greens, celery, spinach), or cooked high residue (cabbage, broccoli, etc)  o Fruit -- Fresh (unpeeled skin & pulp), Dried (prunes, apricots, cherries, etc ),  or stewed ( applesauce)  o Whole grain breads, pasta, etc (whole wheat)  o Bran cereals   Bulking Agents -- This type of water-retaining fiber generally is easily obtained each day by one of the following:  o Psyllium bran -- The psyllium plant is remarkable because its ground seeds can retain so much water. This product is available as Metamucil, Konsyl, Effersyllium, Per Diem Fiber, or the less expensive generic preparation in drug and health food stores. Although labeled a  laxative, it really is not a laxative.  o Methylcellulose -- This is another fiber derived from wood which also retains water. It is available as Citrucel. o Polyethylene Glycol - and artificial fiber commonly called Miralax or Glycolax.  It is helpful for people with gassy or bloated feelings with regular fiber o Flax Seed - a less gassy fiber than psyllium  No reading or other relaxing activity while on the toilet. If bowel movements take longer than 5 minutes, you are too constipated.  AVOID CONSTIPATION.  High fiber and water intake usually takes care of this.  Sometimes a laxative is needed to stimulate more frequent bowel movements, but   Laxatives are not a good long-term solution as it can wear the colon out. o Osmotics (Milk of Magnesia, Fleets phosphosoda, Magnesium citrate, MiraLax, GoLytely) are safer than  o Stimulants (Senokot, Castor Oil, Dulcolax, Ex Lax)    o Do not take laxatives for more than 7days in a row.   IF SEVERELY CONSTIPATED, try a Bowel Retraining Program: o Do not use laxatives.  o Eat a diet high in roughage, such as bran cereals and leafy vegetables.  o Drink six (6) ounces of prune or apricot juice each morning.  o Eat two (2) large servings of stewed fruit each day.  o Take one (1) heaping tablespoon of a psyllium-based bulking agent twice a day. Use sugar-free sweetener when possible to avoid excessive calories.  o Eat a normal breakfast.  o Set aside 15 minutes after breakfast to sit on the toilet, but do not strain to have a bowel movement.  o If you do not have a bowel movement by the third day, use an enema and repeat the above steps.      Fiber Chart  You should 25-30g of fiber per day and drinking 8 glasses of water to help your bowels move regularly.  In the chart below you can look up how much fiber you are getting in an average day.  If you are not getting enough fiber, you should add a fiber supplement to your diet.  Examples of this include  Metamucil, FiberCon and Citrucel.  These can be purchased at your local grocery store or pharmacy.      [email or web address]-[email or web address]

## 2015-07-08 NOTE — Transfer of Care (Signed)
Immediate Anesthesia Transfer of Care Note  Patient: Amber Barrera  Procedure(s) Performed: Procedure(s): DIAGNOSTIC COLONOSCOPY (N/A)  Patient Location: PACU  Anesthesia Type:MAC  Level of Consciousness: awake, alert  and oriented  Airway & Oxygen Therapy: Patient Spontanous Breathing and Patient connected to face mask oxygen  Post-op Assessment: Report given to RN and Post -op Vital signs reviewed and stable  Post vital signs: Reviewed and stable  Last Vitals:  Filed Vitals:   07/08/15 1119  BP: 118/75  Pulse: 79  Temp: 36.6 C  Resp: 13    Complications: No apparent anesthesia complications

## 2015-07-08 NOTE — Anesthesia Preprocedure Evaluation (Signed)
Anesthesia Evaluation  Patient identified by MRN, date of birth, ID band Patient awake    Reviewed: Allergy & Precautions, NPO status , Patient's Chart, lab work & pertinent test results  Airway Mallampati: II  TM Distance: >3 FB Neck ROM: Full    Dental no notable dental hx.    Pulmonary asthma , Current Smoker,  breath sounds clear to auscultation  Pulmonary exam normal       Cardiovascular negative cardio ROS Normal cardiovascular examRhythm:Regular Rate:Normal     Neuro/Psych  Headaches, negative psych ROS   GI/Hepatic negative GI ROS, Neg liver ROS,   Endo/Other  negative endocrine ROS  Renal/GU negative Renal ROS  negative genitourinary   Musculoskeletal negative musculoskeletal ROS (+)   Abdominal (+) + obese,   Peds negative pediatric ROS (+)  Hematology  (+) anemia ,   Anesthesia Other Findings   Reproductive/Obstetrics negative OB ROS                             Anesthesia Physical Anesthesia Plan  ASA: II  Anesthesia Plan: MAC   Post-op Pain Management:    Induction: Intravenous  Airway Management Planned: Natural Airway  Additional Equipment:   Intra-op Plan:   Post-operative Plan:   Informed Consent: I have reviewed the patients History and Physical, chart, labs and discussed the procedure including the risks, benefits and alternatives for the proposed anesthesia with the patient or authorized representative who has indicated his/her understanding and acceptance.   Dental advisory given  Plan Discussed with: CRNA  Anesthesia Plan Comments:         Anesthesia Quick Evaluation

## 2015-07-08 NOTE — H&P (View-Only) (Signed)
History of Present Illness Amber Ruff MD; 2/35/3614 9:42 AM) Patient words: fissure.  The patient is a 39 year old female who presents with a complaint of Rectal bleeding. History: Patient presented self referred due to rectal bleeding. This had been ocurring for 6 months. It was associated with pain during BM's. She developed severe bleeding several days ago and was seen at urgent care. They felt that she had an anal fissure and prescribed her lidocaine ointment for this pain. She has been using this as well as increasing her fiber and water intake. She has had less bleeding but it is not resolved. She does not have a family history significant for inflammatory bowel disease but does have a first cousin who died of colon cancer at age 34.  We were unable to do an anoscopic exam last time due to pain. Her pain has resolved but she continues to have bleeding in her stool and when she wipes. Her bowel movements are more regular after adding fiber to her diet. Problem List/Past Medical Amber Ruff, MD; 4/31/5400 9:47 AM) RECTAL BLEEDING (569.3  K62.5)  Other Problems Amber Ruff, MD; 8/67/6195 9:47 AM) Hemorrhoids Other disease, cancer, significant illness Asthma Gastroesophageal Reflux Disease  Past Surgical History Amber Ruff, MD; 0/93/2671 9:43 AM) No pertinent past surgical history  Diagnostic Studies History Amber Ruff, MD; 2/45/8099 9:43 AM) Pap Smear 1-5 years ago Colonoscopy 1-5 years ago Mammogram 1-3 years ago  Allergies Amber Ruff, MD; 8/33/8250 9:43 AM) Latex Exam Gloves *MEDICAL DEVICES AND SUPPLIES* Reglan *GASTROINTESTINAL AGENTS - MISC.*  Medication History Mammie Lorenzo, LPN; 5/39/7673 4:19 AM) Nortriptyline HCl (25MG  Capsule, Oral) Active. Diclofenac Sodium (50MG  Tablet DR, Oral) Active. Vitamin D (Ergocalciferol) (50000UNIT Capsule, Oral once a week) Active. Magnesium (100MG  Capsule, Oral) Active. Iron (Ferrous  Gluconate) (325MG  Tablet, Oral) Active. Zofran (4MG  Tablet, Oral s) Active. Medications Reconciled  Pregnancy / Birth History Amber Ruff, MD; 3/79/0240 9:43 AM) Maternal age 52-25 Para 70 Age at menarche 63 years. Gravida 4 Regular periods    Vitals Claiborne Billings Dockery LPN; 9/73/5329 9:24 AM) 06/19/2015 9:30 AM Weight: 170.4 lb Height: 62in Body Surface Area: 1.84 m Body Mass Index: 31.17 kg/m Temp.: 98.63F(Oral)  Pulse: 88 (Regular)  BP: 116/74 (Sitting, Left Arm, Standard)     Physical Exam Amber Ruff MD; 2/68/3419 9:47 AM)  General Mental Status-Alert. General Appearance-Consistent with stated age. Hydration-Well hydrated. Voice-Normal.  Head and Neck Head-normocephalic, atraumatic with no lesions or palpable masses. Trachea-midline. Thyroid Gland Characteristics - normal size and consistency.  Eye Eyeball - Bilateral-Extraocular movements intact. Sclera/Conjunctiva - Bilateral-No scleral icterus.  Chest and Lung Exam Chest and lung exam reveals -quiet, even and easy respiratory effort with no use of accessory muscles and on auscultation, normal breath sounds, no adventitious sounds and normal vocal resonance. Inspection Chest Wall - Normal. Back - normal.  Cardiovascular Cardiovascular examination reveals -normal heart sounds, regular rate and rhythm with no murmurs and normal pedal pulses bilaterally.  Abdomen Inspection Inspection of the abdomen reveals - No Hernias. Palpation/Percussion Palpation and Percussion of the abdomen reveal - Soft, Non Tender, No Rebound tenderness, No Rigidity (guarding) and No hepatosplenomegaly.  Rectal Anorectal Exam External - normal external exam. Internal - normal internal exam. Note: Normal sphincter tone. No palpable masses. Proctoscopic exam -Note:See procedure note.   Neurologic Neurologic evaluation reveals -alert and oriented x 3 with no impairment of recent or  remote memory. Mental Status-Normal.  Musculoskeletal Global Assessment -Note:no gross deformities.  Normal Exam - Left-Upper Extremity Strength Normal  and Lower Extremity Strength Normal. Normal Exam - Right-Upper Extremity Strength Normal and Lower Extremity Strength Normal.    Assessment & Plan Amber Ruff MD; 3/78/5885 9:46 AM)  RECTAL BLEEDING (569.3  K62.5) Story: I recommend that he undergo a colonoscopy to evaluate your bleeding more thoroughly. We will call you to schedule this in the next few days. Impression: 39 year old female with a positive family history of colon cancer at a young age. She has rectal bleeding that does not appear to be from an anal source on anoscopy. I have recommended that she undergo a colonoscopy to evaluate this further. Bowel prep instructions were given. We will schedule this as soon as possible. Risks of the procedure including bleeding and possible perforation.

## 2015-07-08 NOTE — Anesthesia Postprocedure Evaluation (Signed)
  Anesthesia Post-op Note  Patient: Amber Barrera  Procedure(s) Performed: Procedure(s) (LRB): DIAGNOSTIC COLONOSCOPY (N/A)  Patient Location: PACU  Anesthesia Type: MAC  Level of Consciousness: awake and alert   Airway and Oxygen Therapy: Patient Spontanous Breathing  Post-op Pain: mild  Post-op Assessment: Post-op Vital signs reviewed, Patient's Cardiovascular Status Stable, Respiratory Function Stable, Patent Airway and No signs of Nausea or vomiting  Last Vitals:  Filed Vitals:   07/08/15 1310  BP: 105/72  Pulse: 69  Temp:   Resp: 20    Post-op Vital Signs: stable   Complications: No apparent anesthesia complications

## 2015-07-08 NOTE — Op Note (Addendum)
[place], [id number]   OPERATIVE PROCEDURE REPORT  PATIENT: Amber Barrera, Amber Barrera  MR#: [id number] BIRTHDATE: [birth year] GENDER: female ENDOSCOPIST: Rosario Adie, MD ASSISTANT:   Laurena Bering PROCEDURE DATE: 07/08/2015 PRE-PROCEDURE PREPARATION: The patient was prepped with 32 oz.  of Suprep the night before the procedure and 32 oz.  the morning of the procedure.  The patient was fasted for 4 hours prior to the procedure. PRE-PROCEDURE PHYSICAL: Patient has stable vital signs.  Neck is supple.  There is no JVD, thyromegaly or LAD.  Chest clear to auscultation.  S1 and S2 regular.  Abdomen soft, non-distended, non-tender with NABS. PROCEDURE:     Colonoscopy, diagnostic ASA CLASS: INDICATIONS:     1.  rectal bleeding. MEDICATIONS:     Per Anesthesia  DESCRIPTION OF PROCEDURE:   After the risks, benefits, and alternatives of the procedure were thoroughly explained [including a 10% missed rate of cancer and polyps], informed consent was obtained.  Digital rectal exam was performed.  The Pentax Ped Colon [id number]  was introduced through the anus  and advanced to the cecum, which was identified by both the appendix and ileocecal valve , limited by No adverse events experienced.   The quality of the prep was good. . Multiple washes were done. Small lesions could be missed. The instrument was then slowly withdrawn as the colon was fully examined. Estimated blood loss is zero unless otherwise noted in this procedure report.     COLON FINDINGS: A normal appearing cecum, ileocecal valve, and appendiceal orifice were identified.  The ascending, transverse, descending, sigmoid colon, and rectum appeared unremarkable.   The entire colonic mucosa appeared healthy with a normal vascular pattern.  No masses, polyps, diverticula or AVMs were noted.  The appendiceal orifice and the ICV were identified and photographed. The  terminal ileum appeared normal.  Retroflexed views revealed no abnormalities.  The patient tolerated the procedure without immediate complications.  The scope was then withdrawn from the patient and the procedure terminated.  TIME TO CECUM:  11 minutes 0 seconds WITHDRAW TIME:  11 minutes 0 seconds  IMPRESSION:     Normal colonoscopy  RECOMMENDATIONS:     High fiber diet  REPEAT EXAM:      In 10 years  for Colonoscopy.  If the patient has any abnormal GI symptoms in the interim, she/he have been advised to contact the office as soon as possible for further recommendations.   REFERRED BY:  eSigned:  Rosario Adie, MD 34/91/7915 12:46 PM   CPT CODES:     [phone] Colonoscopy, flexible, proximal to splenic flexure; diagnostic, with or without collection of specimen(s) by brushing or washing, with or without colon decompression (separate procedure) ICD CODES:     K62.5 Hemorrhage of anus and rectum  The ICD and CPT codes recommended by this software are interpretations from the data that the clinical staff has captured with the software.  The verification of the translation of this report to the ICD and CPT codes and modifiers is the sole responsibility of the health care institution and practicing physician where this report was generated.  [employer] [place] [employer]. will not be held responsible for the validity of the ICD and CPT codes included on this report.  AMA assumes no liability for data contained or not contained herein. CPT is a Designer, television/film set of the [employer].  PATIENT NAME:  Amber Barrera, Amber Barrera MR#: [id number]

## 2015-07-09 ENCOUNTER — Encounter (HOSPITAL_COMMUNITY): Payer: Self-pay | Admitting: General Surgery

## 2015-09-30 ENCOUNTER — Emergency Department (HOSPITAL_BASED_OUTPATIENT_CLINIC_OR_DEPARTMENT_OTHER): Payer: 59

## 2015-09-30 ENCOUNTER — Encounter (HOSPITAL_BASED_OUTPATIENT_CLINIC_OR_DEPARTMENT_OTHER): Payer: Self-pay | Admitting: *Deleted

## 2015-09-30 ENCOUNTER — Emergency Department (HOSPITAL_BASED_OUTPATIENT_CLINIC_OR_DEPARTMENT_OTHER)
Admission: EM | Admit: 2015-09-30 | Discharge: 2015-10-01 | Disposition: A | Discharge status: Home / Self Care | Payer: 59 | Attending: Emergency Medicine | Admitting: Emergency Medicine

## 2015-09-30 DIAGNOSIS — Z91040 Latex allergy status: Secondary | ICD-10-CM | POA: Insufficient documentation

## 2015-09-30 DIAGNOSIS — G43909 Migraine, unspecified, not intractable, without status migrainosus: Secondary | ICD-10-CM | POA: Diagnosis not present

## 2015-09-30 DIAGNOSIS — R05 Cough: Secondary | ICD-10-CM | POA: Diagnosis present

## 2015-09-30 DIAGNOSIS — J189 Pneumonia, unspecified organism: Secondary | ICD-10-CM

## 2015-09-30 DIAGNOSIS — D649 Anemia, unspecified: Secondary | ICD-10-CM | POA: Diagnosis not present

## 2015-09-30 DIAGNOSIS — Z79899 Other long term (current) drug therapy: Secondary | ICD-10-CM | POA: Diagnosis not present

## 2015-09-30 DIAGNOSIS — J45909 Unspecified asthma, uncomplicated: Secondary | ICD-10-CM | POA: Diagnosis not present

## 2015-09-30 DIAGNOSIS — Z720 Tobacco use: Secondary | ICD-10-CM | POA: Insufficient documentation

## 2015-09-30 DIAGNOSIS — J159 Unspecified bacterial pneumonia: Secondary | ICD-10-CM | POA: Insufficient documentation

## 2015-09-30 DIAGNOSIS — Z8719 Personal history of other diseases of the digestive system: Secondary | ICD-10-CM | POA: Insufficient documentation

## 2015-09-30 MED ORDER — HYDROCOD POLST-CPM POLST ER 10-8 MG/5ML PO SUER
5.0000 mL | Freq: Once | ORAL | Status: AC
  Administered 2015-10-01: 5 mL via ORAL
  Filled 2015-09-30: qty 5

## 2015-09-30 MED ORDER — PREDNISONE 50 MG PO TABS
60.0000 mg | ORAL_TABLET | Freq: Once | ORAL | Status: AC
  Administered 2015-09-30: 60 mg via ORAL
  Filled 2015-09-30 (×2): qty 1

## 2015-09-30 MED ORDER — ALBUTEROL SULFATE HFA 108 (90 BASE) MCG/ACT IN AERS
2.0000 | INHALATION_SPRAY | Freq: Once | RESPIRATORY_TRACT | Status: AC
  Administered 2015-09-30: 2 via RESPIRATORY_TRACT
  Filled 2015-09-30: qty 6.7

## 2015-09-30 MED ORDER — LEVOFLOXACIN 750 MG PO TABS
750.0000 mg | ORAL_TABLET | Freq: Once | ORAL | Status: AC
  Administered 2015-09-30: 750 mg via ORAL
  Filled 2015-09-30: qty 1

## 2015-09-30 MED ORDER — ACETAMINOPHEN 500 MG PO TABS
1000.0000 mg | ORAL_TABLET | Freq: Once | ORAL | Status: AC
  Administered 2015-09-30: 1000 mg via ORAL
  Filled 2015-09-30: qty 2

## 2015-09-30 NOTE — ED Notes (Signed)
Pt. Has had two abx. For this same condition recently.

## 2015-09-30 NOTE — ED Notes (Signed)
Cough for 9 days. She has been several times for the cough and has had a negative cxr and negative flu test.

## 2015-09-30 NOTE — ED Notes (Signed)
Per physician order ambulated patient via saturation monitor. Patient maintained oxygen saturations of 99-100% with heart rate of 112-113. Patient maintained steady gait without difficulty. Returned to room. Patient tolerated well.

## 2015-10-01 MED ORDER — LEVOFLOXACIN 750 MG PO TABS: 750.0000 mg | ORAL_TABLET | Freq: Every day | ORAL | Status: DC

## 2015-10-01 MED ORDER — HYDROCOD POLST-CPM POLST ER 10-8 MG/5ML PO SUER: 5.0000 mL | Freq: Two times a day (BID) | ORAL | Status: DC | PRN

## 2015-10-01 MED ORDER — PREDNISONE 10 MG PO TABS: ORAL_TABLET | ORAL | Status: DC

## 2015-10-01 NOTE — ED Provider Notes (Signed)
CSN: [id number]     Arrival date & time 09/30/15  2141 History   First MD Initiated Contact with Patient 09/30/15 2248     Chief Complaint  Patient presents with  . Cough     (Consider location/radiation/quality/duration/timing/severity/associated sxs/prior Treatment) HPI Amber Barrera is a 39 y.o. female who presents to emergency department complaining of flulike symptoms and cough. Patient states that she started having symptoms over a week ago. States started with nasal congestion, cough, sore throat. She states she was seen by a PCP 1 week ago, had negative flu screen, was given a shot of Rocephin. She is placed on amoxicillin which she only took for 3 days. She states she had no improvement in her symptoms at that time. She states she also had a shot of steroid 5 days ago. She reports that she felt like symptoms started to improve, however yesterday she started feeling worse with worsening cough, fever, body aches, pain in the right side. Patient reports that she did have a chest x-ray 5 days ago which was negative at that time. Pt states she has tried OTC meds and hycodan all with no improvement.   Past Medical History  Diagnosis Date  . Migraines   . Rectal bleeding   . Asthma     no problems - in past 3 yrs -no inhalers used  . Anemia     iron deficiency- checked last month- iron supplement started   Past Surgical History  Procedure Laterality Date  . Tubal ligation    . Bunionectomy Bilateral   . Wisdom tooth extraction    . Colonoscopy N/A 07/08/2015    Procedure: DIAGNOSTIC COLONOSCOPY;  Surgeon: Leighton Ruff, MD;  Location: [facility];  Service: Endoscopy;  Laterality: N/A;   No family history on file. Social History  Substance Use Topics  . Smoking status: Current Some Day Smoker  . Smokeless tobacco: Never Used  . Alcohol Use: 0.6 oz/week    1 Glasses of wine per week   OB History    No data available     Review of Systems  Constitutional: Positive for  fever and chills.  HENT: Positive for congestion and sore throat.   Respiratory: Positive for cough, shortness of breath and wheezing. Negative for chest tightness.   Cardiovascular: Negative for chest pain, palpitations and leg swelling.  Gastrointestinal: Negative for nausea, vomiting, abdominal pain and diarrhea.  Musculoskeletal: Negative for myalgias, arthralgias, neck pain and neck stiffness.  Skin: Negative for rash.  Neurological: Negative for dizziness, weakness and headaches.  All other systems reviewed and are negative.     Allergies  Latex; Reglan; and Tape  Home Medications   Prior to Admission medications   Medication Sig Start Date End Date Taking? Authorizing Provider  diclofenac (VOLTAREN) 25 MG EC tablet Take 1 tablet by mouth 3 (three) times daily as needed for moderate pain.  04/28/13   Historical Provider, MD  ferrous sulfate 325 (65 FE) MG tablet Take 325 mg by mouth daily with breakfast.    Historical Provider, MD  lidocaine (XYLOCAINE) 5 % ointment Apply to rectal area before or after bowel movements as needed for pain. Patient not taking: Reported on 07/08/2015 02/13/15   Shanon Rosser, MD  magnesium oxide (MAG-OX) 400 MG tablet Take 400 mg by mouth daily.    Historical Provider, MD  nortriptyline (PAMELOR) 25 MG capsule Take 25 mg by mouth daily as needed (headache).    Historical Provider, MD  Vitamin D, Ergocalciferol, (DRISDOL)  50000 UNITS CAPS capsule Take 50,000 Units by mouth every 7 (seven) days. Tuesday    Historical Provider, MD   BP 111/54 mmHg  Pulse 120  Temp(Src) 102.8 F (39.3 C) (Oral)  Resp 20  Ht 5\' 2"  (1.575 m)  Wt 165 lb (74.844 kg)  BMI 30.17 kg/m2  SpO2 100%  LMP 09/30/2015 Physical Exam  Constitutional: She appears well-developed and well-nourished. No distress.  HENT:  Head: Normocephalic.  Right Ear: Tympanic membrane, external ear and ear canal normal.  Left Ear: Tympanic membrane, external ear and ear canal normal.  Nose:  Mucosal edema and rhinorrhea present.  Mouth/Throat: Uvula is midline, oropharynx is clear and moist and mucous membranes are normal.  Eyes: Conjunctivae are normal.  Neck: Neck supple.  Cardiovascular: Normal rate, regular rhythm and normal heart sounds.   Pulmonary/Chest: Effort normal and breath sounds normal. No respiratory distress. She has no wheezes. She has no rales.  Abdominal: Soft. Bowel sounds are normal. She exhibits no distension. There is no tenderness. There is no rebound.  Musculoskeletal: She exhibits no edema.  Neurological: She is alert.  Skin: Skin is warm and dry.  Psychiatric: She has a normal mood and affect. Her behavior is normal.  Nursing note and vitals reviewed.   ED Course  Procedures (including critical care time) Labs Review Labs Reviewed - No data to display  Imaging Review Dg Chest 2 View  09/30/2015  CLINICAL DATA:  39 year old female with cough, fever and central chest pain for the past 8 days. EXAM: CHEST  2 VIEW COMPARISON:  Chest x-ray 07/06/2007. FINDINGS: Right middle lobe airspace disease compatible with pneumonia. Left lung is clear. No pleural effusions. No evidence of pulmonary edema. Heart size and mediastinal contours are within normal limits. IMPRESSION: 1. Right middle lobe pneumonia. Followup PA and lateral chest X-ray is recommended in 3-4 weeks following trial of antibiotic therapy to ensure resolution and exclude underlying malignancy. Electronically Signed   By: Vinnie Langton M.D.   On: 09/30/2015 22:39   I have personally reviewed and evaluated these images and lab results as part of my medical decision-making.   EKG Interpretation None      MDM   Final diagnoses:  Community acquired pneumonia    Pt with cough for 9 days, worsening symptoms. CXR showing pneumonia. Pt is tachycardic, temp 102.8. Pt received tylenol for temp, inhaler 2 puffs. Will recheck VS.  Ambulated with oxygen in the hall way. Remained oxygen sat of  99-100%. Continues to be tachycardic. Encouraged fluids.   Home with Levaquin given incomplete tx with rocephin and 3 days of amoxil. Also will place on prednisone taper. Return precautions discussed.   Filed Vitals:   09/30/15 2208 09/30/15 2249  BP: 111/54   Pulse: 120   Temp: 102.8 F (39.3 C)   TempSrc: Oral   Resp: 20   Height: 5\' 2"  (1.575 m)   Weight: 165 lb (74.844 kg)   SpO2: 96% 100%         Jeannett Senior, PA-C 10/01/15 0105  Jeannett Senior, PA-C 10/01/15 0113  Merryl Hacker, MD 10/01/15 [phone]

## 2015-10-01 NOTE — Discharge Instructions (Signed)
levaquin as prescribed until all gone. Continue supportive medications, such as tylenol and motrin for fever, decongestants, mucinex. Take tussionex as prescribed as needed for pain and cough. Prednisone until all gone. Follow up with primary care doctor for recheck. Return if worsening.    Community-Acquired Pneumonia, Adult Pneumonia is an infection of the lungs. There are different types of pneumonia. One type can develop while a person is in a hospital. A different type, called community-acquired pneumonia, develops in people who are not, or have not recently been, in the hospital or other health care facility.  CAUSES Pneumonia may be caused by bacteria, viruses, or funguses. Community-acquired pneumonia is often caused by Streptococcus pneumonia bacteria. These bacteria are often passed from one person to another by breathing in droplets from the cough or sneeze of an infected person. RISK FACTORS The condition is more likely to develop in:  People who havechronic diseases, such as chronic obstructive pulmonary disease (COPD), asthma, congestive heart failure, cystic fibrosis, diabetes, or kidney disease.  People who haveearly-stage or late-stage HIV.  People who havesickle cell disease.  People who havehad their spleen removed (splenectomy).  People who havepoor Human resources officer.  People who havemedical conditions that increase the risk of breathing in (aspirating) secretions their own mouth and nose.   People who havea weakened immune system (immunocompromised).  People who smoke.  People whotravel to areas where pneumonia-causing germs commonly exist.  People whoare around animal habitats or animals that have pneumonia-causing germs, including birds, bats, rabbits, cats, and farm animals. SYMPTOMS Symptoms of this condition include:  Adry cough.  A wet (productive) cough.  Fever.  Sweating.  Chest pain, especially when breathing deeply or coughing.  Rapid  breathing or difficulty breathing.  Shortness of breath.  Shaking chills.  Fatigue.  Muscle aches. DIAGNOSIS Your health care provider will take a medical history and perform a physical exam. You may also have other tests, including:  Imaging studies of your chest, including X-rays.  Tests to check your blood oxygen level and other blood gases.  Other tests on blood, mucus (sputum), fluid around your lungs (pleural fluid), and urine. If your pneumonia is severe, other tests may be done to identify the specific cause of your illness. TREATMENT The type of treatment that you receive depends on many factors, such as the cause of your pneumonia, the medicines you take, and other medical conditions that you have. For most adults, treatment and recovery from pneumonia may occur at home. In some cases, treatment must happen in a hospital. Treatment may include:  Antibiotic medicines, if the pneumonia was caused by bacteria.  Antiviral medicines, if the pneumonia was caused by a virus.  Medicines that are given by mouth or through an IV tube.  Oxygen.  Respiratory therapy. Although rare, treating severe pneumonia may include:  Mechanical ventilation. This is done if you are not breathing well on your own and you cannot maintain a safe blood oxygen level.  Thoracentesis. This procedureremoves fluid around one lung or both lungs to help you breathe better. HOME CARE INSTRUCTIONS  Take over-the-counter and prescription medicines only as told by your health care provider.  Only takecough medicine if you are losing sleep. Understand that cough medicine can prevent your body's natural ability to remove mucus from your lungs.  If you were prescribed an antibiotic medicine, take it as told by your health care provider. Do not stop taking the antibiotic even if you start to feel better.  Sleep in a semi-upright position  at night. Try sleeping in a reclining chair, or place a few pillows  under your head.  Do not use tobacco products, including cigarettes, chewing tobacco, and e-cigarettes. If you need help quitting, ask your health care provider.  Drink enough water to keep your urine clear or pale yellow. This will help to thin out mucus secretions in your lungs. PREVENTION There are ways that you can decrease your risk of developing community-acquired pneumonia. Consider getting a pneumococcal vaccine if:  You are older than 37 years of age.  You are older than 37 years of age and are undergoing cancer treatment, have chronic lung disease, or have other medical conditions that affect your immune system. Ask your health care provider if this applies to you. There are different types and schedules of pneumococcal vaccines. Ask your health care provider which vaccination option is best for you. You may also prevent community-acquired pneumonia if you take these actions:  Get an influenza vaccine every year. Ask your health care provider which type of influenza vaccine is best for you.  Go to the dentist on a regular basis.  Wash your hands often. Use hand sanitizer if soap and water are not available. SEEK MEDICAL CARE IF:  You have a fever.  You are losing sleep because you cannot control your cough with cough medicine. SEEK IMMEDIATE MEDICAL CARE IF:  You have worsening shortness of breath.  You have increased chest pain.  Your sickness becomes worse, especially if you are an older adult or have a weakened immune system.  You cough up blood.   This information is not intended to replace advice given to you by your health care provider. Make sure you discuss any questions you have with your health care provider.   Document Released: 11/23/2005 Document Revised: 08/14/2015 Document Reviewed: 03/20/2015 Elsevier Interactive Patient Education [employer].

## 2015-12-21 ENCOUNTER — Emergency Department (HOSPITAL_BASED_OUTPATIENT_CLINIC_OR_DEPARTMENT_OTHER)
Admission: EM | Admit: 2015-12-21 | Discharge: 2015-12-21 | Disposition: A | Discharge status: Home / Self Care | Payer: BLUE CROSS/BLUE SHIELD | Attending: Emergency Medicine | Admitting: Emergency Medicine

## 2015-12-21 ENCOUNTER — Encounter (HOSPITAL_BASED_OUTPATIENT_CLINIC_OR_DEPARTMENT_OTHER): Payer: Self-pay | Admitting: *Deleted

## 2015-12-21 ENCOUNTER — Emergency Department (HOSPITAL_BASED_OUTPATIENT_CLINIC_OR_DEPARTMENT_OTHER): Payer: BLUE CROSS/BLUE SHIELD

## 2015-12-21 DIAGNOSIS — Z79899 Other long term (current) drug therapy: Secondary | ICD-10-CM | POA: Diagnosis not present

## 2015-12-21 DIAGNOSIS — Z91040 Latex allergy status: Secondary | ICD-10-CM | POA: Diagnosis not present

## 2015-12-21 DIAGNOSIS — Z8719 Personal history of other diseases of the digestive system: Secondary | ICD-10-CM | POA: Diagnosis not present

## 2015-12-21 DIAGNOSIS — D509 Iron deficiency anemia, unspecified: Secondary | ICD-10-CM | POA: Insufficient documentation

## 2015-12-21 DIAGNOSIS — Y9389 Activity, other specified: Secondary | ICD-10-CM | POA: Diagnosis not present

## 2015-12-21 DIAGNOSIS — G43909 Migraine, unspecified, not intractable, without status migrainosus: Secondary | ICD-10-CM | POA: Diagnosis not present

## 2015-12-21 DIAGNOSIS — S4992XA Unspecified injury of left shoulder and upper arm, initial encounter: Secondary | ICD-10-CM | POA: Diagnosis present

## 2015-12-21 DIAGNOSIS — Z791 Long term (current) use of non-steroidal anti-inflammatories (NSAID): Secondary | ICD-10-CM | POA: Insufficient documentation

## 2015-12-21 DIAGNOSIS — M25512 Pain in left shoulder: Secondary | ICD-10-CM

## 2015-12-21 DIAGNOSIS — J45909 Unspecified asthma, uncomplicated: Secondary | ICD-10-CM | POA: Diagnosis not present

## 2015-12-21 DIAGNOSIS — Y998 Other external cause status: Secondary | ICD-10-CM | POA: Diagnosis not present

## 2015-12-21 DIAGNOSIS — X58XXXA Exposure to other specified factors, initial encounter: Secondary | ICD-10-CM | POA: Insufficient documentation

## 2015-12-21 DIAGNOSIS — Y9289 Other specified places as the place of occurrence of the external cause: Secondary | ICD-10-CM | POA: Insufficient documentation

## 2015-12-21 MED ORDER — NAPROXEN 500 MG PO TABS: 500.0000 mg | ORAL_TABLET | Freq: Two times a day (BID) | ORAL | Status: DC

## 2015-12-21 MED ORDER — HYDROCODONE-ACETAMINOPHEN 5-325 MG PO TABS: 1.0000 | ORAL_TABLET | Freq: Four times a day (QID) | ORAL | Status: DC | PRN

## 2015-12-21 MED ORDER — KETOROLAC TROMETHAMINE 60 MG/2ML IM SOLN
60.0000 mg | Freq: Once | INTRAMUSCULAR | Status: AC
  Administered 2015-12-21: 60 mg via INTRAMUSCULAR
  Filled 2015-12-21: qty 2

## 2015-12-21 MED ORDER — HYDROCODONE-ACETAMINOPHEN 5-325 MG PO TABS
2.0000 | ORAL_TABLET | Freq: Once | ORAL | Status: AC
Start: 2015-12-21 — End: 2015-12-21
  Administered 2015-12-21: 2 via ORAL
  Filled 2015-12-21: qty 2

## 2015-12-21 NOTE — ED Provider Notes (Signed)
CSN: [id number]     Arrival date & time 12/21/15  [id number] History   First MD Initiated Contact with Patient 12/21/15 [phone]     Chief Complaint  Patient presents with  . Shoulder Pain     (Consider location/radiation/quality/duration/timing/severity/associated sxs/prior Treatment) HPI Comments: Patient is a 40 year old female who presents with complaints of left shoulder pain. This began yesterday in the absence of any injury or trauma. Pain is worse with movement and palpation. She tried taking ibuprofen at home with little relief. She denies any numbness or tingling. She denies any weakness.  Patient is a 40 y.o. female presenting with shoulder pain. The history is provided by the patient.  Shoulder Pain Location:  Shoulder Time since incident:  1 day Shoulder location:  L shoulder Pain details:    Quality:  Shooting   Severity:  Severe   Onset quality:  Sudden   Timing:  Constant   Progression:  Worsening Chronicity:  New   Past Medical History  Diagnosis Date  . Migraines   . Rectal bleeding   . Asthma     no problems - in past 3 yrs -no inhalers used  . Anemia     iron deficiency- checked last month- iron supplement started   Past Surgical History  Procedure Laterality Date  . Tubal ligation    . Bunionectomy Bilateral   . Wisdom tooth extraction    . Colonoscopy N/A 07/08/2015    Procedure: DIAGNOSTIC COLONOSCOPY;  Surgeon: Leighton Ruff, MD;  Location: [facility];  Service: Endoscopy;  Laterality: N/A;   No family history on file. Social History  Substance Use Topics  . Smoking status: Never Smoker   . Smokeless tobacco: Never Used  . Alcohol Use: 0.6 oz/week    1 Glasses of wine per week   OB History    No data available     Review of Systems  All other systems reviewed and are negative.     Allergies  Latex; Reglan; and Tape  Home Medications   Prior to Admission medications   Medication Sig Start Date End Date Taking? Authorizing Provider   diclofenac (VOLTAREN) 50 MG EC tablet Take 50 mg by mouth 2 (two) times daily.   Yes Historical Provider, MD  chlorpheniramine-HYDROcodone (TUSSIONEX PENNKINETIC ER) 10-8 MG/5ML SUER Take 5 mLs by mouth every 12 (twelve) hours as needed for cough. 10/01/15   Tatyana Kirichenko, PA-C  diclofenac (VOLTAREN) 25 MG EC tablet Take 1 tablet by mouth 3 (three) times daily as needed for moderate pain.  04/28/13   Historical Provider, MD  ferrous sulfate 325 (65 FE) MG tablet Take 325 mg by mouth daily with breakfast.    Historical Provider, MD  levofloxacin (LEVAQUIN) 750 MG tablet Take 1 tablet (750 mg total) by mouth daily. 10/01/15   Tatyana Kirichenko, PA-C  lidocaine (XYLOCAINE) 5 % ointment Apply to rectal area before or after bowel movements as needed for pain. Patient not taking: Reported on 07/08/2015 02/13/15   Shanon Rosser, MD  magnesium oxide (MAG-OX) 400 MG tablet Take 400 mg by mouth daily.    Historical Provider, MD  nortriptyline (PAMELOR) 25 MG capsule Take 25 mg by mouth daily as needed (headache).    Historical Provider, MD  predniSONE (DELTASONE) 10 MG tablet Take 5 tab day 1, take 4 tab day 2, take 3 tab day 3, take 2 tab day 4, and take 1 tab day 5 10/01/15   Tatyana Kirichenko, PA-C  Vitamin D, Ergocalciferol, (DRISDOL) 50000 UNITS  CAPS capsule Take 50,000 Units by mouth every 7 (seven) days. Tuesday    Historical Provider, MD   BP 105/73 mmHg  Pulse 77  Temp(Src) 98.4 F (36.9 C) (Oral)  Resp 18  Ht 5\' 2"  (1.575 m)  Wt 107 lb (48.535 kg)  BMI 19.57 kg/m2  SpO2 100% Physical Exam  Constitutional: She is oriented to person, place, and time. She appears well-developed and well-nourished. No distress.  HENT:  Head: Normocephalic and atraumatic.  Neck: Normal range of motion. Neck supple.  Musculoskeletal: Normal range of motion.  The left shoulder appears grossly normal. There is tenderness to palpation overlying the scapula and deltoid. There is pain with range of motion. There is  no warmth, redness, or obvious joint effusion. Ulnar and radial pulses are easily palpable. Strength is 5 out of 5 to the hand and sensation is intact to the entire hand.  Neurological: She is alert and oriented to person, place, and time.  Skin: Skin is warm and dry. She is not diaphoretic.  Nursing note and vitals reviewed.   ED Course  Procedures (including critical care time) Labs Review Labs Reviewed - No data to display  Imaging Review No results found. I have personally reviewed and evaluated these images and lab results as part of my medical decision-making.    MDM   Final diagnoses:  None    Patient presents with complaints of shoulder pain in the absence of any injury or trauma. Is tender over the deltoid and scapula. She has pain with range of motion with arm is neurovascularly intact. I suspect some sort of shoulder sprain/strain. Her x-rays reveal no bony abnormality. She will be discharged with anti-inflammatory's, pain medication, rest, and when necessary return. She will be placed in an arm sling for comfort.    Veryl Speak, MD 12/21/15 [phone]

## 2015-12-21 NOTE — Discharge Instructions (Signed)
Wear arm sling as applied.  Naproxen as prescribed.  Hydrocodone as prescribed as needed for pain not relieved with naproxen.  Follow-up with your primary Dr. if not improving in the next week.   Shoulder Pain The shoulder is the joint that connects your arms to your body. The bones that form the shoulder joint include the upper arm bone (humerus), the shoulder blade (scapula), and the collarbone (clavicle). The top of the humerus is shaped like a ball and fits into a rather flat socket on the scapula (glenoid cavity). A combination of muscles and strong, fibrous tissues that connect muscles to bones (tendons) support your shoulder joint and hold the ball in the socket. Small, fluid-filled sacs (bursae) are located in different areas of the joint. They act as cushions between the bones and the overlying soft tissues and help reduce friction between the gliding tendons and the bone as you move your arm. Your shoulder joint allows a wide range of motion in your arm. This range of motion allows you to do things like scratch your back or throw a ball. However, this range of motion also makes your shoulder more prone to pain from overuse and injury. Causes of shoulder pain can originate from both injury and overuse and usually can be grouped in the following four categories:  Redness, swelling, and pain (inflammation) of the tendon (tendinitis) or the bursae (bursitis).  Instability, such as a dislocation of the joint.  Inflammation of the joint (arthritis).  Broken bone (fracture). HOME CARE INSTRUCTIONS   Apply ice to the sore area.  Put ice in a plastic bag.  Place a towel between your skin and the bag.  Leave the ice on for 15-20 minutes, 3-4 times per day for the first 2 days, or as directed by your health care provider.  Stop using cold packs if they do not help with the pain.  If you have a shoulder sling or immobilizer, wear it as long as your caregiver instructs. Only remove it to  shower or bathe. Move your arm as little as possible, but keep your hand moving to prevent swelling.  Squeeze a soft ball or foam pad as much as possible to help prevent swelling.  Only take over-the-counter or prescription medicines for pain, discomfort, or fever as directed by your caregiver. SEEK MEDICAL CARE IF:   Your shoulder pain increases, or new pain develops in your arm, hand, or fingers.  Your hand or fingers become cold and numb.  Your pain is not relieved with medicines. SEEK IMMEDIATE MEDICAL CARE IF:   Your arm, hand, or fingers are numb or tingling.  Your arm, hand, or fingers are significantly swollen or turn white or blue. MAKE SURE YOU:   Understand these instructions.  Will watch your condition.  Will get help right away if you are not doing well or get worse.   This information is not intended to replace advice given to you by your health care provider. Make sure you discuss any questions you have with your health care provider.   Document Released: 09/02/2005 Document Revised: 12/14/2014 Document Reviewed: 03/18/2015 Elsevier Interactive Patient Education [employer].

## 2015-12-21 NOTE — ED Notes (Addendum)
C/o left shoulder pain onset yesterday  Has had back pain for a while and has had her back "popped"   Rt scapula and shoulder pain onset yesterday getting worse,  Took 2 ibu short time a go with out relief

## 2016-04-23 DIAGNOSIS — Z90710 Acquired absence of both cervix and uterus: Secondary | ICD-10-CM | POA: Insufficient documentation

## 2016-10-04 ENCOUNTER — Emergency Department (HOSPITAL_BASED_OUTPATIENT_CLINIC_OR_DEPARTMENT_OTHER): Payer: BLUE CROSS/BLUE SHIELD

## 2016-10-04 ENCOUNTER — Emergency Department (HOSPITAL_BASED_OUTPATIENT_CLINIC_OR_DEPARTMENT_OTHER)
Admission: EM | Admit: 2016-10-04 | Discharge: 2016-10-04 | Disposition: A | Discharge status: Home / Self Care | Payer: BLUE CROSS/BLUE SHIELD | Attending: Emergency Medicine | Admitting: Emergency Medicine

## 2016-10-04 ENCOUNTER — Encounter (HOSPITAL_BASED_OUTPATIENT_CLINIC_OR_DEPARTMENT_OTHER): Payer: Self-pay | Admitting: *Deleted

## 2016-10-04 DIAGNOSIS — K4020 Bilateral inguinal hernia, without obstruction or gangrene, not specified as recurrent: Secondary | ICD-10-CM | POA: Diagnosis not present

## 2016-10-04 DIAGNOSIS — Z79899 Other long term (current) drug therapy: Secondary | ICD-10-CM | POA: Diagnosis not present

## 2016-10-04 DIAGNOSIS — J45909 Unspecified asthma, uncomplicated: Secondary | ICD-10-CM | POA: Insufficient documentation

## 2016-10-04 DIAGNOSIS — R1030 Lower abdominal pain, unspecified: Secondary | ICD-10-CM | POA: Diagnosis present

## 2016-10-04 LAB — BASIC METABOLIC PANEL
ANION GAP: 6 (ref 5–15)
BUN: 15 mg/dL (ref 6–20)
CALCIUM: 9.5 mg/dL (ref 8.9–10.3)
CO2: 26 mmol/L (ref 22–32)
Chloride: 104 mmol/L (ref 101–111)
Creatinine, Ser: 0.78 mg/dL (ref 0.44–1.00)
GFR calc Af Amer: >60 mL/min (ref >60)
GFR calc non Af Amer: >60 mL/min (ref >60)
GLUCOSE: 99 mg/dL (ref 65–99)
Potassium: 3.5 mmol/L (ref 3.5–5.1)
Sodium: 136 mmol/L (ref 135–145)

## 2016-10-04 LAB — CBC WITH DIFFERENTIAL/PLATELET
BASOS PCT: 0 %
Basophils Absolute: 0 10*3/uL (ref 0.0–0.1)
Eosinophils Absolute: 0.1 10*3/uL (ref 0.0–0.7)
Eosinophils Relative: 1 %
HEMATOCRIT: 32.7 % — AB (ref 36.0–46.0)
HEMOGLOBIN: 10.5 g/dL — AB (ref 12.0–15.0)
LYMPHS PCT: 18 %
Lymphs Abs: 2.2 10*3/uL (ref 0.7–4.0)
MCH: 21.8 pg — AB (ref 26.0–34.0)
MCHC: 32.1 g/dL (ref 30.0–36.0)
MCV: 68 fL — AB (ref 78.0–100.0)
MONO ABS: 1.1 10*3/uL — AB (ref 0.1–1.0)
Monocytes Relative: 9 %
NEUTROS ABS: 8.9 10*3/uL — AB (ref 1.7–7.7)
Neutrophils Relative %: 73 %
Platelets: 275 10*3/uL (ref 150–400)
RBC: 4.81 MIL/uL (ref 3.87–5.11)
RDW: 15.3 % (ref 11.5–15.5)
WBC: 12.3 10*3/uL — ABNORMAL HIGH (ref 4.0–10.5)

## 2016-10-04 LAB — URINALYSIS, ROUTINE W REFLEX MICROSCOPIC
BILIRUBIN URINE: NEGATIVE
GLUCOSE, UA: NEGATIVE mg/dL
HGB URINE DIPSTICK: NEGATIVE
Ketones, ur: 15 mg/dL — AB
Leukocytes, UA: NEGATIVE
Nitrite: NEGATIVE
PH: 6 (ref 5.0–8.0)
Protein, ur: NEGATIVE mg/dL
SPECIFIC GRAVITY, URINE: 1.027 (ref 1.005–1.030)

## 2016-10-04 MED ORDER — IOPAMIDOL (ISOVUE-300) INJECTION 61%
100.0000 mL | Freq: Once | INTRAVENOUS | Status: AC | PRN
  Administered 2016-10-04: 100 mL via INTRAVENOUS

## 2016-10-04 MED ORDER — HYDROMORPHONE HCL 1 MG/ML IJ SOLN
1.0000 mg | Freq: Once | INTRAMUSCULAR | Status: AC
  Administered 2016-10-04: 1 mg via INTRAVENOUS
  Filled 2016-10-04: qty 1

## 2016-10-04 MED ORDER — DICLOFENAC SODIUM 25 MG PO TBEC: 25.0000 mg | DELAYED_RELEASE_TABLET | Freq: Three times a day (TID) | ORAL | 0 refills | Status: DC | PRN

## 2016-10-04 NOTE — Discharge Instructions (Signed)
Read the information below.  Use the prescribed medication as directed.  Please discuss all new medications with your pharmacist.  You may return to the Emergency Department at any time for worsening condition or any new symptoms that concern you.   If you develop high fevers, worsening abdominal pain, uncontrolled vomiting, or are unable to tolerate fluids by mouth, return to the ER for a recheck.  ° °

## 2016-10-04 NOTE — ED Triage Notes (Signed)
Pt reports R groin pain x1wk; states she developed sudden pain while walking at work and was seen by PCP this past Monday -- states she was given prednisone and hydrocodone which were ineffective. States pain has worsened over time. Denies fever, n/v/d, numbness/tingling in leg. Reports pain radiates down to knee. Reports this happen 1 yr ago and pain got better on its own.

## 2016-10-04 NOTE — ED Provider Notes (Signed)
[facility] Provider Note   CSN: [id number] Arrival date & time: 10/04/16  [id number]     History   Chief Complaint Chief Complaint  Patient presents with  . Groin Pain    HPI Amber Barrera is a 40 y.o. female.  HPI   Patient presents with right inguinal pain that began one week ago without injury.  The pain is described as cramping and tearing, it radiates around her right hip and down the front of her thigh, it is constant.  No palliative factors including prednisone and vicodin prescribed by PCP.  Holding pressure over the area helps somewhat.  Exacerbated by palpation and walking. Was seen by PCP and right hip xray and US performed but no results yet, dipped urinalysis was negative.  S/P partial hysterectomy for fibroids. Denies fevers, chills, myalgias, urinary, vaginal, or bowel symptoms.    Past Medical History:  Diagnosis Date  . Anemia    iron deficiency- checked last month- iron supplement started  . Asthma    no problems - in past 3 yrs -no inhalers used  . Migraines   . Rectal bleeding     There are no active problems to display for this patient.   Past Surgical History:  Procedure Laterality Date  . ABDOMINAL HYSTERECTOMY    . BUNIONECTOMY Bilateral   . COLONOSCOPY N/A 07/08/2015   Procedure: DIAGNOSTIC COLONOSCOPY;  Surgeon: Leighton Ruff, MD;  Location: [facility];  Service: Endoscopy;  Laterality: N/A;  . TUBAL LIGATION    . WISDOM TOOTH EXTRACTION      OB History    No data available       Home Medications    Prior to Admission medications   Medication Sig Start Date End Date Taking? Authorizing Provider  nortriptyline (PAMELOR) 25 MG capsule Take 25 mg by mouth daily as needed (headache).   Yes Historical Provider, MD  diclofenac (VOLTAREN) 25 MG EC tablet Take 1 tablet (25 mg total) by mouth 3 (three) times daily as needed for moderate pain. 10/04/16   Clayton Bibles, PA-C  ferrous sulfate 325 (65 FE) MG tablet Take 325 mg by mouth  daily with breakfast.    Historical Provider, MD  HYDROcodone-acetaminophen (NORCO) 5-325 MG tablet Take 1-2 tablets by mouth every 6 (six) hours as needed. 12/21/15   Veryl Speak, MD  magnesium oxide (MAG-OX) 400 MG tablet Take 400 mg by mouth daily.    Historical Provider, MD  Vitamin D, Ergocalciferol, (DRISDOL) 50000 UNITS CAPS capsule Take 50,000 Units by mouth every 7 (seven) days. Tuesday    Historical Provider, MD    Family History No family history on file.  Social History Social History  Substance Use Topics  . Smoking status: Never Smoker  . Smokeless tobacco: Never Used  . Alcohol use 0.6 oz/week    1 Glasses of wine per week     Allergies   Latex; Reglan [metoclopramide]; and Tape   Review of Systems Review of Systems  All other systems reviewed and are negative.    Physical Exam Updated Vital Signs BP 104/77 (BP Location: Left Arm)   Pulse 88   Temp 98.1 F (36.7 C) (Oral)   Resp 20   Ht 5\' 2"  (1.575 m)   Wt 77.6 kg   LMP 09/30/2015   SpO2 100%   BMI 31.28 kg/m   Physical Exam  Constitutional: She appears well-developed and well-nourished. No distress.  HENT:  Head: Normocephalic and atraumatic.  Neck: Neck supple.  Cardiovascular:  Normal rate and regular rhythm.   Pulmonary/Chest: Effort normal and breath sounds normal. No respiratory distress. She has no wheezes. She has no rales.  Abdominal: Soft. She exhibits no distension. There is tenderness. There is no rebound and no guarding.    Neurological: She is alert.  Skin: She is not diaphoretic.  Nursing note and vitals reviewed.    ED Treatments / Results  Labs (all labs ordered are listed, but only abnormal results are displayed) Labs Reviewed  CBC WITH DIFFERENTIAL/PLATELET - Abnormal; Notable for the following:       Result Value   WBC 12.3 (*)    Hemoglobin 10.5 (*)    HCT 32.7 (*)    MCV 68.0 (*)    MCH 21.8 (*)    Neutro Abs 8.9 (*)    Monocytes Absolute 1.1 (*)    All other  components within normal limits  URINALYSIS, ROUTINE W REFLEX MICROSCOPIC (NOT AT [facility]) - Abnormal; Notable for the following:    Ketones, ur 15 (*)    All other components within normal limits  BASIC METABOLIC PANEL    EKG  EKG Interpretation None       Radiology Ct Abdomen Pelvis W Contrast  Result Date: 10/04/2016 CLINICAL DATA:  Right lower abdominal pain for 1 week. Evaluate for inguinal hernia. EXAM: CT ABDOMEN AND PELVIS WITH CONTRAST TECHNIQUE: Multidetector CT imaging of the abdomen and pelvis was performed using the standard protocol following bolus administration of intravenous contrast. CONTRAST:  144mL ISOVUE-300 IOPAMIDOL (ISOVUE-300) INJECTION 61% COMPARISON:  July 10, 2014 FINDINGS: Lower chest: No acute abnormality. Hepatobiliary: There is a tiny cyst in the hepatic dome on series 2, image 7, too small to completely characterize. A probable tiny cyst is seen in the inferior right hepatic lobe as well on image 31. No suspicious masses identified. The liver, gallbladder, and portal vein are otherwise normal. Pancreas: Unremarkable. No pancreatic ductal dilatation or surrounding inflammatory changes. Spleen: Normal in size without focal abnormality. Adrenals/Urinary Tract: Adrenal glands are unremarkable. Kidneys are normal, without renal calculi, focal lesion, or hydronephrosis. Bladder is unremarkable. Stomach/Bowel: Stomach is within normal limits. Appendix appears normal. No evidence of bowel wall thickening, distention, or inflammatory changes. Vascular/Lymphatic: No significant vascular findings are present. No enlarged abdominal or pelvic lymph nodes. Reproductive: Patient is status post hysterectomy. The ovaries are normal in appearance. Other: A small amount of free fluid is seen in the pelvis, likely physiologic. There is a fat containing umbilical hernia. A small amount of fat is seen in both inguinal regions, new in the interval, likely small fat containing inguinal  hernias. Musculoskeletal: No acute or significant osseous findings. IMPRESSION: 1. No acute abnormalities are identified. A small amount of fluid in the pelvis is likely physiologic. Patient is status post hysterectomy. The ovaries are normal in appearance. 2. The appendix is well seen with no evidence of appendicitis. 3. Small fat containing inguinal hernias. Small fat containing umbilical hernia. Electronically Signed   By: Dorise Bullion III M.D   On: 10/04/2016 11:18    Procedures Procedures (including critical care time)  Medications Ordered in ED Medications  HYDROmorphone (DILAUDID) injection 1 mg (1 mg Intravenous Given 10/04/16 0949)  iopamidol (ISOVUE-300) 61 % injection 100 mL (100 mLs Intravenous Contrast Given 10/04/16 1049)     Initial Impression / Assessment and Plan / ED Course  I have reviewed the triage vital signs and the nursing notes.  Pertinent labs & imaging results that were available during my care  of the patient were reviewed by me and considered in my medical decision making (see chart for details).  Clinical Course    Afebrile, nontoxic patient with atraumatic right inguinal pain,  No associated symptoms. Pain is located directly over area common for direct inguinal hernia.  Pt does have bilateral inguinal hernias and umbilical hernia that are new since last scan.  D/C home with PCP follow up, referral to [facility] for further evaluation.  Pt has norco at home.  Had added NSAID.  Discussed result, findings, treatment, and follow up  with patient.  Pt given return precautions.  Pt verbalizes understanding and agrees with plan.       Final Clinical Impressions(s) / ED Diagnoses   Final diagnoses:  Non-recurrent bilateral inguinal hernia without obstruction or gangrene    New Prescriptions Discharge Medication List as of 10/04/2016 11:49 AM       Clayton Bibles, PA-C 10/04/16 1602    Gwenyth Allegra Tegeler, MD 10/05/16 0800

## 2016-10-04 NOTE — ED Notes (Signed)
PA-C at bedside 

## 2017-01-16 DIAGNOSIS — R509 Fever, unspecified: Secondary | ICD-10-CM | POA: Diagnosis present

## 2017-01-16 DIAGNOSIS — Z79899 Other long term (current) drug therapy: Secondary | ICD-10-CM | POA: Diagnosis not present

## 2017-01-16 DIAGNOSIS — R05 Cough: Secondary | ICD-10-CM | POA: Diagnosis not present

## 2017-01-16 DIAGNOSIS — R079 Chest pain, unspecified: Secondary | ICD-10-CM | POA: Insufficient documentation

## 2017-01-16 DIAGNOSIS — J45909 Unspecified asthma, uncomplicated: Secondary | ICD-10-CM | POA: Insufficient documentation

## 2017-01-17 ENCOUNTER — Emergency Department (HOSPITAL_BASED_OUTPATIENT_CLINIC_OR_DEPARTMENT_OTHER)
Admission: EM | Admit: 2017-01-17 | Discharge: 2017-01-17 | Disposition: A | Discharge status: Home / Self Care | Payer: BLUE CROSS/BLUE SHIELD | Attending: Emergency Medicine | Admitting: Emergency Medicine

## 2017-01-17 ENCOUNTER — Emergency Department (HOSPITAL_BASED_OUTPATIENT_CLINIC_OR_DEPARTMENT_OTHER): Payer: BLUE CROSS/BLUE SHIELD

## 2017-01-17 ENCOUNTER — Encounter (HOSPITAL_BASED_OUTPATIENT_CLINIC_OR_DEPARTMENT_OTHER): Payer: Self-pay | Admitting: *Deleted

## 2017-01-17 DIAGNOSIS — R509 Fever, unspecified: Secondary | ICD-10-CM | POA: Diagnosis not present

## 2017-01-17 DIAGNOSIS — R69 Illness, unspecified: Secondary | ICD-10-CM

## 2017-01-17 DIAGNOSIS — J111 Influenza due to unidentified influenza virus with other respiratory manifestations: Secondary | ICD-10-CM

## 2017-01-17 MED ORDER — IBUPROFEN 800 MG PO TABS
800.0000 mg | ORAL_TABLET | Freq: Once | ORAL | Status: AC
  Administered 2017-01-17: 800 mg via ORAL
  Filled 2017-01-17: qty 1

## 2017-01-17 MED ORDER — HYDROCOD POLST-CPM POLST ER 10-8 MG/5ML PO SUER: 5.0000 mL | Freq: Two times a day (BID) | ORAL | 0 refills | Status: DC | PRN

## 2017-01-17 MED ORDER — ONDANSETRON 4 MG PO TBDP
4.0000 mg | ORAL_TABLET | Freq: Once | ORAL | Status: AC
  Administered 2017-01-17: 4 mg via ORAL
  Filled 2017-01-17: qty 1

## 2017-01-17 MED ORDER — ONDANSETRON 8 MG PO TBDP
8.0000 mg | ORAL_TABLET | Freq: Three times a day (TID) | ORAL | 0 refills | Status: DC | PRN
Start: 2017-01-17 — End: 2017-10-01

## 2017-01-17 MED ORDER — HYDROCOD POLST-CPM POLST ER 10-8 MG/5ML PO SUER
5.0000 mL | Freq: Once | ORAL | Status: AC
  Administered 2017-01-17: 5 mL via ORAL
  Filled 2017-01-17: qty 5

## 2017-01-17 NOTE — ED Triage Notes (Signed)
Pt reports generalized body aches with 'lung pain' x 2-3 days.  Pt ambulatory.

## 2017-01-17 NOTE — ED Provider Notes (Signed)
[place] [facility] Provider Note: Georgena Spurling, MD, FACEP  CSN: [id number] MRN: [id number] ARRIVAL: 01/16/17 at [place]: [facility] Body Aches   HISTORY OF PRESENT ILLNESS  Amber Barrera is a 41 y.o. female with a three-day history of generalized body aches, malaise, fever to 101.7, cough, chills and chest soreness exacerbated by cough. She is seen by her PCP 2 days ago and tested negative for influenza. She has been taking only Tylenol for her symptoms without adequate relief. She describes her symptoms as severe. She was not started on Tamiflu.   Past Medical History:  Diagnosis Date  . Anemia    iron deficiency- checked last month- iron supplement started  . Asthma    no problems - in past 3 yrs -no inhalers used  . Migraines   . Rectal bleeding     Past Surgical History:  Procedure Laterality Date  . ABDOMINAL HYSTERECTOMY    . BUNIONECTOMY Bilateral   . COLONOSCOPY N/A 07/08/2015   Procedure: DIAGNOSTIC COLONOSCOPY;  Surgeon: Leighton Ruff, MD;  Location: [facility];  Service: Endoscopy;  Laterality: N/A;  . TUBAL LIGATION    . WISDOM TOOTH EXTRACTION      History reviewed. No pertinent family history.  Social History  Substance Use Topics  . Smoking status: Never Smoker  . Smokeless tobacco: Never Used  . Alcohol use 0.6 oz/week    1 Glasses of wine per week    Prior to Admission medications   Medication Sig Start Date End Date Taking? Authorizing Provider  chlorpheniramine-HYDROcodone (TUSSIONEX PENNKINETIC ER) 10-8 MG/5ML SUER Take 5 mLs by mouth every 12 (twelve) hours as needed. 01/17/17   Teofilo Lupinacci, MD  ferrous sulfate 325 (65 FE) MG tablet Take 325 mg by mouth daily with breakfast.    Historical Provider, MD  magnesium oxide (MAG-OX) 400 MG tablet Take 400 mg by mouth daily.    Historical Provider, MD  nortriptyline (PAMELOR) 25 MG capsule Take 25 mg by mouth daily as needed (headache).    Historical Provider, MD   ondansetron (ZOFRAN ODT) 8 MG disintegrating tablet Take 1 tablet (8 mg total) by mouth every 8 (eight) hours as needed for nausea or vomiting. 01/17/17   Shanon Rosser, MD  Vitamin D, Ergocalciferol, (DRISDOL) 50000 UNITS CAPS capsule Take 50,000 Units by mouth every 7 (seven) days. Tuesday    Historical Provider, MD    Allergies Latex; Reglan [metoclopramide]; and Tape   REVIEW OF SYSTEMS  Negative except as noted here or in the History of Present Illness.   PHYSICAL EXAMINATION  Initial Vital Signs Blood pressure 109/66, pulse 89, temperature 100.6 F (38.1 C), temperature source Oral, resp. rate 19, height 5' 2.5" (1.588 m), weight 175 lb (79.4 kg), last menstrual period 09/30/2015, SpO2 100 %.  Examination General: Well-developed, well-nourished female in no acute distress; appearance consistent with age of record HENT: normocephalic; atraumatic Eyes: pupils equal, round and reactive to light; extraocular muscles intact Neck: supple Heart: regular rate and rhythm Lungs: clear to auscultation bilaterally Abdomen: soft; nondistended; nontender; no masses or hepatosplenomegaly; bowel sounds present Extremities: No deformity; full range of motion; pulses normal Neurologic: Awake, alert and oriented; motor function intact in all extremities and symmetric; no facial droop Skin: Warm and dry Psychiatric: Tearful   RESULTS  Summary of this visit's results, reviewed by myself:   EKG Interpretation  Date/Time:    Ventricular Rate:    PR Interval:    QRS Duration:  QT Interval:    QTC Calculation:   R Axis:     Text Interpretation:        Laboratory Studies: No results found for this or any previous visit (from the past 24 hour(s)). Imaging Studies: Dg Chest 2 View  Result Date: 01/17/2017 CLINICAL DATA:  41 y/o  F; cough, fevers, chills. EXAM: CHEST  2 VIEW COMPARISON:  09/30/2015 chest radiograph FINDINGS: Stable heart size and mediastinal contours are within normal  limits. Both lungs are clear. The visualized skeletal structures are unremarkable. IMPRESSION: No active cardiopulmonary disease. Electronically Signed   By: Kristine Garbe M.D.   On: 01/17/2017 01:41    ED COURSE  Nursing notes and initial vitals signs, including pulse oximetry, reviewed.  Vitals:   01/17/17 0023 01/17/17 0024 01/17/17 0359  BP: 110/75  109/66  Pulse: 110  89  Resp: 22  19  Temp: 99.7 F (37.6 C)  100.6 F (38.1 C)  TempSrc: Oral  Oral  SpO2: 100%  100%  Weight:  175 lb (79.4 kg)   Height:  5' 2.5" (1.588 m)     PROCEDURES    ED DIAGNOSES     ICD-9-CM ICD-10-CM   1. Influenza-like illness 799.89 R69        Shanon Rosser, MD 01/17/17 [phone]

## 2017-01-17 NOTE — ED Notes (Addendum)
At about 3:15 am, pt called nurse's phone crying and requesting nurse.  Nurse was not available at the time and asked me to tell patient that we would be with her as soon as possible

## 2017-09-30 ENCOUNTER — Emergency Department (HOSPITAL_BASED_OUTPATIENT_CLINIC_OR_DEPARTMENT_OTHER)
Admission: EM | Admit: 2017-09-30 | Discharge: 2017-10-01 | Disposition: A | Discharge status: Home / Self Care | Payer: BLUE CROSS/BLUE SHIELD | Attending: Emergency Medicine | Admitting: Emergency Medicine

## 2017-09-30 ENCOUNTER — Encounter (HOSPITAL_BASED_OUTPATIENT_CLINIC_OR_DEPARTMENT_OTHER): Payer: Self-pay

## 2017-09-30 DIAGNOSIS — Z79899 Other long term (current) drug therapy: Secondary | ICD-10-CM | POA: Diagnosis not present

## 2017-09-30 DIAGNOSIS — J45909 Unspecified asthma, uncomplicated: Secondary | ICD-10-CM | POA: Diagnosis not present

## 2017-09-30 DIAGNOSIS — M542 Cervicalgia: Secondary | ICD-10-CM | POA: Diagnosis present

## 2017-09-30 DIAGNOSIS — D509 Iron deficiency anemia, unspecified: Secondary | ICD-10-CM | POA: Diagnosis not present

## 2017-09-30 DIAGNOSIS — Z91040 Latex allergy status: Secondary | ICD-10-CM | POA: Insufficient documentation

## 2017-09-30 NOTE — ED Triage Notes (Signed)
Pt c/o dizziness, right ear pain and right side sore throat for over a month.  Has been seen elsewhere and had a CT scan done, no diagnosis has been made per patient.

## 2017-10-01 DIAGNOSIS — M542 Cervicalgia: Secondary | ICD-10-CM | POA: Diagnosis not present

## 2017-10-01 LAB — URINALYSIS, ROUTINE W REFLEX MICROSCOPIC
Bilirubin Urine: NEGATIVE
GLUCOSE, UA: NEGATIVE mg/dL
HGB URINE DIPSTICK: NEGATIVE
KETONES UR: NEGATIVE mg/dL
Leukocytes, UA: NEGATIVE
Nitrite: NEGATIVE
PROTEIN: NEGATIVE mg/dL
Specific Gravity, Urine: 1.015 (ref 1.005–1.030)
pH: 6 (ref 5.0–8.0)

## 2017-10-01 LAB — CBC WITH DIFFERENTIAL/PLATELET
BASOS PCT: 1 %
Basophils Absolute: 0.1 10*3/uL (ref 0.0–0.1)
EOS PCT: 3 %
Eosinophils Absolute: 0.2 10*3/uL (ref 0.0–0.7)
HCT: 32.8 % — ABNORMAL LOW (ref 36.0–46.0)
HEMOGLOBIN: 10.6 g/dL — AB (ref 12.0–15.0)
Lymphocytes Relative: 32 %
Lymphs Abs: 2.4 10*3/uL (ref 0.7–4.0)
MCH: 21.9 pg — AB (ref 26.0–34.0)
MCHC: 32.3 g/dL (ref 30.0–36.0)
MCV: 67.6 fL — ABNORMAL LOW (ref 78.0–100.0)
Monocytes Absolute: 0.8 10*3/uL (ref 0.1–1.0)
Monocytes Relative: 11 %
NEUTROS PCT: 53 %
Neutro Abs: 3.9 10*3/uL (ref 1.7–7.7)
Platelets: 226 10*3/uL (ref 150–400)
RBC: 4.85 MIL/uL (ref 3.87–5.11)
RDW: 15.1 % (ref 11.5–15.5)
WBC: 7.4 10*3/uL (ref 4.0–10.5)

## 2017-10-01 LAB — BASIC METABOLIC PANEL
ANION GAP: 5 (ref 5–15)
BUN: 12 mg/dL (ref 6–20)
CHLORIDE: 103 mmol/L (ref 101–111)
CO2: 25 mmol/L (ref 22–32)
Calcium: 8.8 mg/dL — ABNORMAL LOW (ref 8.9–10.3)
Creatinine, Ser: 0.62 mg/dL (ref 0.44–1.00)
GFR calc Af Amer: >60 mL/min (ref >60)
GFR calc non Af Amer: >60 mL/min (ref >60)
GLUCOSE: 96 mg/dL (ref 65–99)
POTASSIUM: 3.8 mmol/L (ref 3.5–5.1)
SODIUM: 133 mmol/L — AB (ref 135–145)

## 2017-10-01 MED ORDER — HYDROMORPHONE HCL 1 MG/ML IJ SOLN
2.0000 mg | Freq: Once | INTRAMUSCULAR | Status: AC
  Administered 2017-10-01: 2 mg via INTRAMUSCULAR
  Filled 2017-10-01: qty 2

## 2017-10-01 NOTE — ED Provider Notes (Signed)
[place] [facility] Provider Note: Georgena Spurling, MD, FACEP  CSN: [id number] MRN: [id number] ARRIVAL: 09/30/17 at 2347 ROOM: MH02/MH02   CHIEF COMPLAINT  Neck Pain   HISTORY OF PRESENT ILLNESS  10/01/17 1:20 AM Amber Barrera is a 41 y.o. female with about a 78-month history of discomfort in the right side of her throat and neck.  She denies it feeling like a sore throat.  The pain has been intermittent and worsening gradually.  The pain comes and goes.  It frequently radiates to the right side of her face and head and recently has involved her right occipital region.  The pain is not like her usual migraines which are different in nature and responsive to sumatriptan.  She states the pain in her right occipital region is the most uncomfortable area and rates it a 5 out of 10.  It is worse with movement or palpation.  In addition to the discomfort she has also had intermittent episodes of dizziness.  These are the sudden sensation that she might pass out although she has not actually passed out or fallen.  She also has a sense of general malaise and weakness.  She has seen her PCP who tested her for strep which was negative.   Past Medical History:  Diagnosis Date  . Anemia    iron deficiency- checked last month- iron supplement started  . Asthma    no problems - in past 3 yrs -no inhalers used  . Migraines   . Rectal bleeding     Past Surgical History:  Procedure Laterality Date  . ABDOMINAL HYSTERECTOMY    . BUNIONECTOMY Bilateral   . COLONOSCOPY N/A 07/08/2015   Procedure: DIAGNOSTIC COLONOSCOPY;  Surgeon: Leighton Ruff, MD;  Location: [facility];  Service: Endoscopy;  Laterality: N/A;  . TUBAL LIGATION    . WISDOM TOOTH EXTRACTION      No family history on file.  Social History  Substance Use Topics  . Smoking status: Never Smoker  . Smokeless tobacco: Never Used  . Alcohol use 0.6 oz/week    1 Glasses of wine per week    Prior to Admission medications     Medication Sig Start Date End Date Taking? Authorizing Provider  chlorpheniramine-HYDROcodone (TUSSIONEX PENNKINETIC ER) 10-8 MG/5ML SUER Take 5 mLs by mouth every 12 (twelve) hours as needed. 01/17/17   Shalamar Crays, MD  ferrous sulfate 325 (65 FE) MG tablet Take 325 mg by mouth daily with breakfast.    [provider]  magnesium oxide (MAG-OX) 400 MG tablet Take 400 mg by mouth daily.    [provider]  nortriptyline (PAMELOR) 25 MG capsule Take 25 mg by mouth daily as needed (headache).    [provider]  ondansetron (ZOFRAN ODT) 8 MG disintegrating tablet Take 1 tablet (8 mg total) by mouth every 8 (eight) hours as needed for nausea or vomiting. 01/17/17   Cassidy Tashiro, MD  Vitamin D, Ergocalciferol, (DRISDOL) 50000 UNITS CAPS capsule Take 50,000 Units by mouth every 7 (seven) days. Tuesday    [provider]    Allergies Latex; Reglan [metoclopramide]; and Tape   REVIEW OF SYSTEMS  Negative except as noted here or in the History of Present Illness.   PHYSICAL EXAMINATION  Initial Vital Signs Blood pressure 112/75, pulse 73, temperature 98.5 F (36.9 C), temperature source Oral, resp. rate 18, height 5\' 2"  (1.575 m), weight 82.1 kg (181 lb), last menstrual period 09/30/2015, SpO2 100 %.  Examination General: Well-developed,  well-nourished female in no acute distress; appearance consistent with age of record HENT: normocephalic; atraumatic; no pharyngeal erythema or exudate; no dysphonia; uvula midline Eyes: pupils equal, round and reactive to light; extraocular muscles intact Neck: supple; right sided tenderness most prominent in the right occiput; no lymphadenopathy; no masses palpated; no thyromegaly Heart: regular rate and rhythm Lungs: clear to auscultation bilaterally Abdomen: soft; nondistended; nontender; no masses or hepatosplenomegaly; bowel sounds present Extremities: No deformity; full range of motion; pulses normal Neurologic:  Awake, alert and oriented; motor function intact in all extremities and symmetric; no facial droop Skin: Warm and dry Psychiatric: Normal mood and affect   RESULTS  Summary of this visit's results, reviewed by myself:   EKG Interpretation  Date/Time:    Ventricular Rate:    PR Interval:    QRS Duration:   QT Interval:    QTC Calculation:   R Axis:     Text Interpretation:        Laboratory Studies: Results for orders placed or performed during the hospital encounter of 09/30/17 (from the past 24 hour(s))  CBC with Differential/Platelet     Status: Abnormal   Collection Time: 10/01/17  1:45 AM  Result Value Ref Range   WBC 7.4 4.0 - 10.5 K/uL   RBC 4.85 3.87 - 5.11 MIL/uL   Hemoglobin 10.6 (L) 12.0 - 15.0 g/dL   HCT 32.8 (L) 36.0 - 46.0 %   MCV 67.6 (L) 78.0 - 100.0 fL   MCH 21.9 (L) 26.0 - 34.0 pg   MCHC 32.3 30.0 - 36.0 g/dL   RDW 15.1 11.5 - 15.5 %   Platelets 226 150 - 400 K/uL   Neutrophils Relative % 53 %   Lymphocytes Relative 32 %   Monocytes Relative 11 %   Eosinophils Relative 3 %   Basophils Relative 1 %   Neutro Abs 3.9 1.7 - 7.7 K/uL   Lymphs Abs 2.4 0.7 - 4.0 K/uL   Monocytes Absolute 0.8 0.1 - 1.0 K/uL   Eosinophils Absolute 0.2 0.0 - 0.7 K/uL   Basophils Absolute 0.1 0.0 - 0.1 K/uL   RBC Morphology TARGET CELLS   Basic metabolic panel     Status: Abnormal   Collection Time: 10/01/17  1:45 AM  Result Value Ref Range   Sodium 133 (L) 135 - 145 mmol/L   Potassium 3.8 3.5 - 5.1 mmol/L   Chloride 103 101 - 111 mmol/L   CO2 25 22 - 32 mmol/L   Glucose, Bld 96 65 - 99 mg/dL   BUN 12 6 - 20 mg/dL   Creatinine, Ser 0.62 0.44 - 1.00 mg/dL   Calcium 8.8 (L) 8.9 - 10.3 mg/dL   GFR calc non Af Amer >60 >60 mL/min   GFR calc Af Amer >60 >60 mL/min   Anion gap 5 5 - 15  Urinalysis, Routine w reflex microscopic     Status: None   Collection Time: 10/01/17  1:49 AM  Result Value Ref Range   Color, Urine YELLOW YELLOW   APPearance CLEAR CLEAR   Specific  Gravity, Urine 1.015 1.005 - 1.030   pH 6.0 5.0 - 8.0   Glucose, UA NEGATIVE NEGATIVE mg/dL   Hgb urine dipstick NEGATIVE NEGATIVE   Bilirubin Urine NEGATIVE NEGATIVE   Ketones, ur NEGATIVE NEGATIVE mg/dL   Protein, ur NEGATIVE NEGATIVE mg/dL   Nitrite NEGATIVE NEGATIVE   Leukocytes, UA NEGATIVE NEGATIVE   Imaging Studies: No results found.  ED COURSE  Nursing notes and initial  vitals signs, including pulse oximetry, reviewed.  Vitals:   09/30/17 2351 09/30/17 2352  BP:  112/75  Pulse:  73  Resp:  18  Temp:  98.5 F (36.9 C)  TempSrc:  Oral  SpO2:  100%  Weight: 82.1 kg (181 lb)   Height: 5\' 2"  (1.575 m)    Patient advised of lab studies showing stable iron deficiency anemia.  She is requesting a shot for pain here in the ED but will follow up with her primary care physician for longer term pain management.  PROCEDURES    ED DIAGNOSES     ICD-10-CM   1. Neck pain M54.2   2. Chronic iron deficiency anemia D50.9        Berry Gallacher, MD 10/01/17 5701

## 2017-10-01 NOTE — ED Notes (Signed)
Family at bedside. 

## 2019-06-26 ENCOUNTER — Emergency Department (HOSPITAL_COMMUNITY)
Admission: EM | Admit: 2019-06-26 | Discharge: 2019-06-26 | Disposition: A | Discharge status: Home / Self Care | Payer: BC Managed Care – PPO | Attending: Emergency Medicine | Admitting: Emergency Medicine

## 2019-06-26 ENCOUNTER — Encounter (HOSPITAL_COMMUNITY): Payer: Self-pay | Admitting: Emergency Medicine

## 2019-06-26 ENCOUNTER — Other Ambulatory Visit: Payer: Self-pay

## 2019-06-26 ENCOUNTER — Emergency Department (HOSPITAL_COMMUNITY): Payer: BC Managed Care – PPO

## 2019-06-26 DIAGNOSIS — J45909 Unspecified asthma, uncomplicated: Secondary | ICD-10-CM | POA: Diagnosis not present

## 2019-06-26 DIAGNOSIS — Z91040 Latex allergy status: Secondary | ICD-10-CM | POA: Diagnosis not present

## 2019-06-26 DIAGNOSIS — Z79899 Other long term (current) drug therapy: Secondary | ICD-10-CM | POA: Insufficient documentation

## 2019-06-26 DIAGNOSIS — R0789 Other chest pain: Secondary | ICD-10-CM

## 2019-06-26 DIAGNOSIS — Z20828 Contact with and (suspected) exposure to other viral communicable diseases: Secondary | ICD-10-CM | POA: Diagnosis not present

## 2019-06-26 DIAGNOSIS — J9801 Acute bronchospasm: Secondary | ICD-10-CM | POA: Insufficient documentation

## 2019-06-26 LAB — BASIC METABOLIC PANEL
Anion gap: 7 (ref 5–15)
BUN: 5 mg/dL — ABNORMAL LOW (ref 6–20)
CO2: 25 mmol/L (ref 22–32)
Calcium: 8.7 mg/dL — ABNORMAL LOW (ref 8.9–10.3)
Chloride: 104 mmol/L (ref 98–111)
Creatinine, Ser: 0.81 mg/dL (ref 0.44–1.00)
GFR calc Af Amer: >60 mL/min (ref >60)
GFR calc non Af Amer: >60 mL/min (ref >60)
Glucose, Bld: 94 mg/dL (ref 70–99)
Potassium: 3.9 mmol/L (ref 3.5–5.1)
Sodium: 136 mmol/L (ref 135–145)

## 2019-06-26 LAB — CBC
HCT: 34 % — ABNORMAL LOW (ref 36.0–46.0)
Hemoglobin: 10.4 g/dL — ABNORMAL LOW (ref 12.0–15.0)
MCH: 21.9 pg — ABNORMAL LOW (ref 26.0–34.0)
MCHC: 30.6 g/dL (ref 30.0–36.0)
MCV: 71.6 fL — ABNORMAL LOW (ref 80.0–100.0)
Platelets: UNDETERMINED 10*3/uL (ref 150–400)
RBC: 4.75 MIL/uL (ref 3.87–5.11)
RDW: 14.6 % (ref 11.5–15.5)
WBC: 6.1 10*3/uL (ref 4.0–10.5)
nRBC: 0 % (ref 0.0–0.2)

## 2019-06-26 LAB — I-STAT BETA HCG BLOOD, ED (MC, WL, AP ONLY): I-stat hCG, quantitative: <5 m[IU]/mL (ref <5)

## 2019-06-26 LAB — SARS CORONAVIRUS 2 BY RT PCR (HOSPITAL ORDER, PERFORMED IN ~~LOC~~ HOSPITAL LAB): SARS Coronavirus 2: NEGATIVE

## 2019-06-26 LAB — TROPONIN I (HIGH SENSITIVITY)
Troponin I (High Sensitivity): <2 ng/L (ref <18)
Troponin I (High Sensitivity): <2 ng/L (ref <18)

## 2019-06-26 LAB — D-DIMER, QUANTITATIVE: D-Dimer, Quant: 0.47 ug/mL-FEU (ref 0.00–0.50)

## 2019-06-26 MED ORDER — ALBUTEROL SULFATE HFA 108 (90 BASE) MCG/ACT IN AERS
1.0000 | INHALATION_SPRAY | RESPIRATORY_TRACT | Status: DC | PRN
  Administered 2019-06-26: 2 via RESPIRATORY_TRACT
  Filled 2019-06-26: qty 6.7

## 2019-06-26 MED ORDER — PREDNISONE 20 MG PO TABS: ORAL_TABLET | ORAL | 0 refills | Status: DC

## 2019-06-26 MED ORDER — SODIUM CHLORIDE 0.9% FLUSH: 3.0000 mL | Freq: Once | INTRAVENOUS | Status: DC

## 2019-06-26 MED ORDER — PREDNISONE 20 MG PO TABS
60.0000 mg | ORAL_TABLET | Freq: Once | ORAL | Status: AC
  Administered 2019-06-26: 60 mg via ORAL
  Filled 2019-06-26: qty 3

## 2019-06-26 NOTE — ED Provider Notes (Signed)
[facility] DEPARTMENT Provider Note   CSN: [id number] Arrival date & time: 06/26/19  5852    History   Chief Complaint Chief Complaint  Patient presents with  . Chest Pain  . Shortness of Breath    HPI Amber Barrera is a 43 y.o. female.     HPI Patient had episode of right-sided sharp chest pain 5 days ago.  This resolved spontaneously.  Since that time she has had chest heaviness and shortness of breath especially with exertion.  Of the last 2 days she is developed a nonproductive cough.  She denies any fever or chills.  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  No family history of DVT/PE.  Patient does complain of some right-sided leg pain compared to the left. Past Medical History:  Diagnosis Date  . Anemia    iron deficiency- checked last month- iron supplement started  . Asthma    no problems - in past 3 yrs -no inhalers used  . Migraines   . Rectal bleeding     There are no active problems to display for this patient.   Past Surgical History:  Procedure Laterality Date  . ABDOMINAL HYSTERECTOMY    . BUNIONECTOMY Bilateral   . COLONOSCOPY N/A 07/08/2015   Procedure: DIAGNOSTIC COLONOSCOPY;  Surgeon: Leighton Ruff, MD;  Location: [facility];  Service: Endoscopy;  Laterality: N/A;  . TUBAL LIGATION    . WISDOM TOOTH EXTRACTION       OB History   No obstetric history on file.      Home Medications    Prior to Admission medications   Medication Sig Start Date End Date Taking? Authorizing Provider  eszopiclone (LUNESTA) 2 MG TABS tablet Take 2 mg by mouth at bedtime as needed for sleep. Take immediately before bedtime   Yes [provider]  predniSONE (DELTASONE) 20 MG tablet 3 tabs po day one, then 2 po daily x 4 days 06/27/19   Julianne Rice, MD    Family History No family history on file.  Social History Social History   Tobacco Use  . Smoking status: Never  Smoker  . Smokeless tobacco: Never Used  Substance Use Topics  . Alcohol use: Yes    Alcohol/week: 1.0 standard drinks    Types: 1 Glasses of wine per week  . Drug use: No     Allergies   Latex, Reglan [metoclopramide], and Tape   Review of Systems Review of Systems  Constitutional: Negative for chills and fever.  HENT: Negative for sore throat.   Respiratory: Positive for cough, chest tightness and shortness of breath.   Cardiovascular: Positive for chest pain and leg swelling. Negative for palpitations.  Gastrointestinal: Negative for abdominal pain, constipation, diarrhea, nausea and vomiting.  Genitourinary: Negative for dysuria and flank pain.  Musculoskeletal: Negative for back pain, myalgias and neck pain.  Skin: Negative for rash and wound.  Neurological: Negative for dizziness, weakness, light-headedness, numbness and headaches.  All other systems reviewed and are negative.    Physical Exam Updated Vital Signs BP 100/72   Pulse 85   Temp 98.3 F (36.8 C)   Resp 18   Ht 5\' 2"  (1.575 m)   Wt 79.8 kg   LMP 09/30/2015 Comment: hysterectomy  SpO2 99%   BMI 32.19 kg/m   Physical Exam Vitals signs and nursing note reviewed.  Constitutional:      General: She is not in acute distress.  Appearance: Normal appearance. She is well-developed. She is not ill-appearing.  HENT:     Head: Normocephalic and atraumatic.     Nose: Nose normal.  Eyes:     Pupils: Pupils are equal, round, and reactive to light.  Neck:     Musculoskeletal: Normal range of motion and neck supple.  Cardiovascular:     Rate and Rhythm: Normal rate and regular rhythm.     Heart sounds: No murmur. No friction rub. No gallop.   Pulmonary:     Effort: Pulmonary effort is normal.     Comments: Diminished air movement in bilateral bases.  No respiratory distress. Abdominal:     General: Bowel sounds are normal.     Palpations: Abdomen is soft.     Tenderness: There is no abdominal  tenderness. There is no guarding or rebound.  Musculoskeletal: Normal range of motion.        General: No tenderness.     Comments: Mild right calf swelling compared to left.  Distal pulses intact.  Skin:    General: Skin is warm and dry.     Findings: No erythema or rash.  Neurological:     General: No focal deficit present.     Mental Status: She is alert and oriented to person, place, and time.  Psychiatric:        Behavior: Behavior normal.      ED Treatments / Results  Labs (all labs ordered are listed, but only abnormal results are displayed) Labs Reviewed  BASIC METABOLIC PANEL - Abnormal; Notable for the following components:      Result Value   BUN 5 (*)    Calcium 8.7 (*)    All other components within normal limits  CBC - Abnormal; Notable for the following components:   Hemoglobin 10.4 (*)    HCT 34.0 (*)    MCV 71.6 (*)    MCH 21.9 (*)    All other components within normal limits  SARS CORONAVIRUS 2 (HOSPITAL ORDER, [facility])  D-DIMER, QUANTITATIVE (NOT AT [facility])  I-STAT BETA HCG BLOOD, ED ([facility], [facility], [facility] ONLY)  TROPONIN I (HIGH SENSITIVITY)  TROPONIN I (HIGH SENSITIVITY)    EKG EKG Interpretation  Date/Time:  Monday June 26 2019 08:52:42 EDT Ventricular Rate:  83 PR Interval:  214 QRS Duration: 76 QT Interval:  368 QTC Calculation: 432 R Axis:   40 Text Interpretation:  Sinus rhythm with 1st degree A-V block Otherwise normal ECG Confirmed by Julianne Rice [phone]) on 06/26/2019 10:17:24 AM   Radiology Dg Chest Port 1 View  Result Date: 06/26/2019 CLINICAL DATA:  Shortness of breath.  Chest pain. EXAM: PORTABLE CHEST 1 VIEW COMPARISON:  01/17/2017. FINDINGS: Mediastinum and hilar structures normal. Lungs are clear. No pleural effusion pneumothorax. Heart size stable. No acute bony abnormality. IMPRESSION: No acute cardiopulmonary disease. Electronically Signed   By: Marcello Moores  Register   On: 06/26/2019 10:41    Procedures  Procedures (including critical care time)  Medications Ordered in ED Medications  sodium chloride flush (NS) 0.9 % injection 3 mL (has no administration in time range)  albuterol (VENTOLIN HFA) 108 (90 Base) MCG/ACT inhaler 1-2 puff (2 puffs Inhalation Given 06/26/19 1228)  predniSONE (DELTASONE) tablet 60 mg (60 mg Oral Given 06/26/19 1228)     Initial Impression / Assessment and Plan / ED Course  I have reviewed the triage vital signs and the nursing notes.  Pertinent labs & imaging results that were available during  my care of the patient were reviewed by me and considered in my medical decision making (see chart for details).       Patient states her chest tightness and shortness of breath is improved after albuterol treatment.  Given dose of prednisone.  Patient has a normal d-dimer.  COVID testing is negative.  EKG without ischemic findings.  Troponin x2 is normal.  Likely symptoms represent bronchospasm/mild asthma exacerbation.  Will give short course of steroids.  Return precautions given.   Final Clinical Impressions(s) / ED Diagnoses   Final diagnoses:  Bronchospasm  Atypical chest pain    ED Discharge Orders         Ordered    predniSONE (DELTASONE) 20 MG tablet     06/26/19 1338           Julianne Rice, MD 06/26/19 1339

## 2019-06-26 NOTE — ED Triage Notes (Signed)
Pt here for eval of shortness of breath, worse with walking, with chest pain to right breast since Wednesday. Occasional cough. Denies swelling or weight gain.

## 2019-08-11 ENCOUNTER — Encounter (HOSPITAL_COMMUNITY): Payer: Self-pay | Admitting: Emergency Medicine

## 2019-08-11 ENCOUNTER — Emergency Department (HOSPITAL_COMMUNITY)
Admission: EM | Admit: 2019-08-11 | Discharge: 2019-08-12 | Disposition: A | Discharge status: Home / Self Care | Payer: BC Managed Care – PPO | Attending: Emergency Medicine | Admitting: Emergency Medicine

## 2019-08-11 ENCOUNTER — Other Ambulatory Visit: Payer: Self-pay

## 2019-08-11 DIAGNOSIS — R109 Unspecified abdominal pain: Secondary | ICD-10-CM | POA: Diagnosis not present

## 2019-08-11 DIAGNOSIS — Z5321 Procedure and treatment not carried out due to patient leaving prior to being seen by health care provider: Secondary | ICD-10-CM | POA: Diagnosis not present

## 2019-08-11 LAB — CBC WITH DIFFERENTIAL/PLATELET
Abs Immature Granulocytes: 0.01 10*3/uL (ref 0.00–0.07)
Basophils Absolute: 0.1 10*3/uL (ref 0.0–0.1)
Basophils Relative: 1 %
Eosinophils Absolute: 0.2 10*3/uL (ref 0.0–0.5)
Eosinophils Relative: 3 %
HCT: 36.5 % (ref 36.0–46.0)
Hemoglobin: 10.9 g/dL — ABNORMAL LOW (ref 12.0–15.0)
Immature Granulocytes: 0 %
Lymphocytes Relative: 37 %
Lymphs Abs: 2.9 10*3/uL (ref 0.7–4.0)
MCH: 21.7 pg — ABNORMAL LOW (ref 26.0–34.0)
MCHC: 29.9 g/dL — ABNORMAL LOW (ref 30.0–36.0)
MCV: 72.6 fL — ABNORMAL LOW (ref 80.0–100.0)
Monocytes Absolute: 0.7 10*3/uL (ref 0.1–1.0)
Monocytes Relative: 8 %
Neutro Abs: 4.1 10*3/uL (ref 1.7–7.7)
Neutrophils Relative %: 51 %
Platelets: 239 10*3/uL (ref 150–400)
RBC: 5.03 MIL/uL (ref 3.87–5.11)
RDW: 14.9 % (ref 11.5–15.5)
WBC: 8 10*3/uL (ref 4.0–10.5)
nRBC: 0 % (ref 0.0–0.2)

## 2019-08-11 LAB — URINALYSIS, ROUTINE W REFLEX MICROSCOPIC
Bacteria, UA: NONE SEEN
Bilirubin Urine: NEGATIVE
Glucose, UA: NEGATIVE mg/dL
Ketones, ur: NEGATIVE mg/dL
Nitrite: NEGATIVE
Protein, ur: NEGATIVE mg/dL
Specific Gravity, Urine: 1.021 (ref 1.005–1.030)
pH: 6 (ref 5.0–8.0)

## 2019-08-11 LAB — BASIC METABOLIC PANEL
Anion gap: 8 (ref 5–15)
BUN: 11 mg/dL (ref 6–20)
CO2: 24 mmol/L (ref 22–32)
Calcium: 9 mg/dL (ref 8.9–10.3)
Chloride: 103 mmol/L (ref 98–111)
Creatinine, Ser: 0.86 mg/dL (ref 0.44–1.00)
GFR calc Af Amer: >60 mL/min (ref >60)
GFR calc non Af Amer: >60 mL/min (ref >60)
Glucose, Bld: 87 mg/dL (ref 70–99)
Potassium: 4.2 mmol/L (ref 3.5–5.1)
Sodium: 135 mmol/L (ref 135–145)

## 2019-08-11 NOTE — ED Triage Notes (Signed)
Patient reports worsening right groin pain radiating to right leg onset last week , denies injury , pain increases with ambulation .

## 2019-09-05 ENCOUNTER — Other Ambulatory Visit: Payer: Self-pay | Admitting: Surgery

## 2019-09-08 ENCOUNTER — Other Ambulatory Visit: Payer: Self-pay | Admitting: Surgery

## 2019-09-08 DIAGNOSIS — G8929 Other chronic pain: Secondary | ICD-10-CM

## 2019-09-08 DIAGNOSIS — R1031 Right lower quadrant pain: Secondary | ICD-10-CM

## 2019-09-12 ENCOUNTER — Ambulatory Visit (HOSPITAL_COMMUNITY)
Admission: RE | Admit: 2019-09-12 | Discharge: 2019-09-12 | Disposition: A | Discharge status: Home / Self Care | Payer: BC Managed Care – PPO | Source: Ambulatory Visit | Attending: Surgery | Admitting: Surgery

## 2019-09-12 ENCOUNTER — Other Ambulatory Visit: Payer: Self-pay

## 2019-09-12 ENCOUNTER — Other Ambulatory Visit: Payer: Self-pay | Admitting: Surgery

## 2019-09-12 DIAGNOSIS — G8929 Other chronic pain: Secondary | ICD-10-CM

## 2019-09-12 DIAGNOSIS — R1031 Right lower quadrant pain: Secondary | ICD-10-CM | POA: Diagnosis present

## 2019-09-18 ENCOUNTER — Ambulatory Visit (HOSPITAL_COMMUNITY): Payer: BC Managed Care – PPO

## 2019-09-24 ENCOUNTER — Other Ambulatory Visit: Payer: BC Managed Care – PPO

## 2019-09-25 ENCOUNTER — Other Ambulatory Visit: Payer: Self-pay | Admitting: Surgery

## 2019-12-08 DIAGNOSIS — M797 Fibromyalgia: Secondary | ICD-10-CM

## 2019-12-08 HISTORY — DX: Fibromyalgia: M79.7

## 2019-12-09 ENCOUNTER — Emergency Department (HOSPITAL_COMMUNITY)
Admission: EM | Admit: 2019-12-09 | Discharge: 2019-12-09 | Disposition: A | Discharge status: Home / Self Care | Payer: Medicaid Other | Attending: Emergency Medicine | Admitting: Emergency Medicine

## 2019-12-09 ENCOUNTER — Other Ambulatory Visit: Payer: Self-pay

## 2019-12-09 ENCOUNTER — Encounter (HOSPITAL_COMMUNITY): Payer: Self-pay | Admitting: Emergency Medicine

## 2019-12-09 DIAGNOSIS — R519 Headache, unspecified: Secondary | ICD-10-CM | POA: Insufficient documentation

## 2019-12-09 DIAGNOSIS — Z5321 Procedure and treatment not carried out due to patient leaving prior to being seen by health care provider: Secondary | ICD-10-CM | POA: Diagnosis not present

## 2019-12-09 DIAGNOSIS — M7918 Myalgia, other site: Secondary | ICD-10-CM | POA: Diagnosis not present

## 2019-12-09 NOTE — ED Notes (Signed)
Called for pt x3. No answer.

## 2019-12-09 NOTE — ED Triage Notes (Signed)
Patient reports intermittent headache with right side body aches for 2 weeks , alert and oriented  / respirations unlabored , denies injury or fall/no fever or chills. Ambulatory.

## 2020-10-29 ENCOUNTER — Other Ambulatory Visit: Payer: Self-pay

## 2020-11-07 ENCOUNTER — Other Ambulatory Visit: Payer: Self-pay

## 2020-11-07 DIAGNOSIS — M79606 Pain in leg, unspecified: Secondary | ICD-10-CM

## 2020-11-12 ENCOUNTER — Other Ambulatory Visit: Payer: Self-pay

## 2020-11-12 ENCOUNTER — Encounter: Payer: Self-pay | Admitting: Vascular Surgery

## 2020-11-12 ENCOUNTER — Ambulatory Visit (HOSPITAL_COMMUNITY)
Admission: RE | Admit: 2020-11-12 | Discharge: 2020-11-12 | Disposition: A | Discharge status: Home / Self Care | Payer: 59 | Source: Ambulatory Visit | Attending: Vascular Surgery | Admitting: Vascular Surgery

## 2020-11-12 ENCOUNTER — Ambulatory Visit (INDEPENDENT_AMBULATORY_CARE_PROVIDER_SITE_OTHER): Payer: Medicaid Other | Admitting: Vascular Surgery

## 2020-11-12 VITALS — BP 103/77 | HR 74 | Temp 98.1°F | Resp 20 | Ht 62.0 in | Wt 181.7 lb

## 2020-11-12 DIAGNOSIS — M25551 Pain in right hip: Secondary | ICD-10-CM

## 2020-11-12 DIAGNOSIS — M79606 Pain in leg, unspecified: Secondary | ICD-10-CM | POA: Diagnosis not present

## 2020-11-12 NOTE — Progress Notes (Signed)
ASSESSMENT & PLAN:  44 y.o. female without evidence of peripheral arterial disease. Point tenderness over the right greater trochanter. No tenderness with AROM or PROM. Will refer to orthopedics. Follow up PRN.  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  She reports she has right lateral hip pain that radiates to her anterior, posterior, and medial thigh.  Pain is worsened by activity.  Pain improves with rest.  She denies any symptoms typical of intermittent claudication, ischemic rest pain.  She has no history of ischemic ulceration.  He is a non-smoker.    Past Medical History:  Diagnosis Date  . Abdominal pain   . Allergic rhinitis due to pollen   . Anemia    iron deficiency- checked last month- iron supplement started  . Asthma    no problems - in past 3 yrs -no inhalers used  . Change in bowel habits   . Depressed mood   . Dysmenorrhea   . Fatigue   . Fibromyalgia 2021  . GERD (gastroesophageal reflux disease)   . Hypercholesterolemia   . Irregular heartbeat   . Migraines   . Mixed hyperlipidemia   . Myalgia   . Nausea & vomiting   . Pain   . Poor sleep   . Rectal bleeding   . Right leg pain   . SOB (shortness of breath)   . Vitamin D deficiency   . Weakness     Past Surgical History:  Procedure Laterality Date  . ABDOMINAL HYSTERECTOMY    . BUNIONECTOMY Bilateral   . COLONOSCOPY N/A 07/08/2015   Procedure: DIAGNOSTIC COLONOSCOPY;  Surgeon: Leighton Ruff, MD;  Location: [facility];  Service: Endoscopy;  Laterality: N/A;  . HERNIA REPAIR    . TUBAL LIGATION    . WISDOM TOOTH EXTRACTION      Family History  Problem Relation Age of Onset  . Hyperlipidemia Mother   . Hypertension Mother   . Diabetes Mellitus II Father   . Heart disease Maternal Grandmother   . Heart disease Maternal Grandfather    . Colon cancer Maternal Aunt   . Breast cancer Maternal Aunt   . Colon cancer Cousin     Social History   Socioeconomic History  . Marital status: Single    Spouse name: Not on file  . Number of children: 4  . Years of education: Not on file  . Highest education level: Not on file  Occupational History  . Not on file  Tobacco Use  . Smoking status: Never Smoker  . Smokeless tobacco: Never Used  Substance and Sexual Activity  . Alcohol use: Yes    Alcohol/week: 1.0 standard drink    Types: 1 Glasses of wine per week  . Drug use: No  . Sexual activity: Yes    Birth control/protection: Surgical  Other Topics Concern  . Not on file  Social History Narrative  . Not on file   Social Determinants of Health   Financial Resource Strain:   . Difficulty of Paying Living Expenses: Not on file  Food Insecurity:   . Worried About Charity fundraiser in the Last Year: Not on file  . Ran Out of Food in the Last Year: Not on file  Transportation Needs:   . Lack of Transportation (Medical): Not on file  .  Lack of Transportation (Non-Medical): Not on file  Physical Activity:   . Days of Exercise per Week: Not on file  . Minutes of Exercise per Session: Not on file  Stress:   . Feeling of Stress : Not on file  Social Connections:   . Frequency of Communication with Friends and Family: Not on file  . Frequency of Social Gatherings with Friends and Family: Not on file  . Attends Religious Services: Not on file  . Active Member of Clubs or Organizations: Not on file  . Attends Archivist Meetings: Not on file  . Marital Status: Not on file  Intimate Partner Violence:   . Fear of Current or Ex-Partner: Not on file  . Emotionally Abused: Not on file  . Physically Abused: Not on file  . Sexually Abused: Not on file    Allergies  Allergen Reactions  . Latex   . Reglan [Metoclopramide] Other (See Comments)    Body tingling  . Tape Itching    Paper tape ok     Current Outpatient Medications  Medication Sig Dispense Refill  . chlorhexidine (PERIDEX) 0.12 % solution SMARTSIG:By Mouth    . DULoxetine (CYMBALTA) 30 MG capsule Take 30 mg by mouth daily.    . eszopiclone (LUNESTA) 2 MG TABS tablet Take 2 mg by mouth at bedtime as needed for sleep. Take immediately before bedtime    . FEROSUL 325 (65 Fe) MG tablet Take 325 mg by mouth daily.    Marland Kitchen ibuprofen (ADVIL) 600 MG tablet Take 600 mg by mouth every 6 (six) hours as needed.    Marland Kitchen LINZESS 290 MCG CAPS capsule Take 290 mcg by mouth daily.    . magnesium oxide (MAG-OX) 400 (241.3 Mg) MG tablet Take 1 tablet by mouth daily.    . meclizine (ANTIVERT) 25 MG tablet TAKE 1 TABLET BY MOUTH THREE TIMES DAILY FOR DIZZINESS AND FOR NAUSEA    . meloxicam (MOBIC) 15 MG tablet Take 15 mg by mouth daily.    Marland Kitchen oxyCODONE (OXY IR/ROXICODONE) 5 MG immediate release tablet Take 5 mg by mouth 3 (three) times daily as needed.    . predniSONE (DELTASONE) 20 MG tablet 3 tabs po day one, then 2 po daily x 4 days 11 tablet 0  . PROAIR HFA 108 (90 Base) MCG/ACT inhaler Inhale into the lungs.     No current facility-administered medications for this visit.    REVIEW OF SYSTEMS:  [X]  denotes positive finding, [ ]  denotes negative finding Cardiac  Comments:  Chest pain or chest pressure:    Shortness of breath upon exertion:    Short of breath when lying flat:    Irregular heart rhythm:        Vascular    Pain in calf, thigh, or hip brought on by ambulation: x   Pain in feet at night that wakes you up from your sleep:  x   Blood clot in your veins:    Leg swelling:         Pulmonary    Oxygen at home:    Productive cough:     Wheezing:         Neurologic    Sudden weakness in arms or legs:     Sudden numbness in arms or legs:     Sudden onset of difficulty speaking or slurred speech:    Temporary loss of vision in one eye:     Problems with dizziness:  Gastrointestinal    Blood in stool:      Vomited blood:         Genitourinary    Burning when urinating:     Blood in urine:        Psychiatric    Major depression:         Hematologic    Bleeding problems:    Problems with blood clotting too easily:        Skin    Rashes or ulcers:        Constitutional    Fever or chills:     PHYSICAL EXAM:   Vitals:   11/12/20 1326  BP: 103/77  Pulse: 74  Resp: 20  Temp: 98.1 F (36.7 C)  SpO2: 100%  Weight: 181 lb 11.2 oz (82.4 kg)  Height: 5\' 2"  (1.575 m)   Constitutional: Well appearing in no distress. Appears well nourished.  Neurologic: Normal gait and station. CN intact. No weakness. No sensory loss. Psychiatric: Mood and affect symmetric and appropriate. Eyes: No icterus. No conjunctival pallor. Ears, nose, throat: mucous membranes moist. Midline trachea. No carotid bruit. Cardiac: regular rate and rhythm.  Respiratory: unlabored. Abdominal: soft, non-tender, non-distended. No palpable pulsatile abdominal mass. Peripheral vascular:  Dorsalis pedis pulse: L 2+ / R 2+  Posterior tibial pulse: L 2+ / R 2+ Extremity: No edema. No cyanosis. No pallor.  Point tenderness over the right greater trochanter.  No pain with active or passive range of motion of the right hip. Skin: No gangrene. No ulceration.  Lymphatic: No Stemmer's sign. No palpable lymphadenopathy.   DATA REVIEW:    Most recent CBC CBC Latest Ref Rng & Units 08/11/2019 06/26/2019 10/01/2017  WBC 4.0 - 10.5 K/uL 8.0 6.1 7.4  Hemoglobin 12.0 - 15.0 g/dL 10.9(L) 10.4(L) 10.6(L)  Hematocrit 36 - 46 % 36.5 34.0(L) 32.8(L)  Platelets 150 - 400 K/uL 239 PLATELET CLUMPS NOTED ON SMEAR, UNABLE TO ESTIMATE 226     Most recent CMP CMP Latest Ref Rng & Units 08/11/2019 06/26/2019 10/01/2017  Glucose 70 - 99 mg/dL 87 94 96  BUN 6 - 20 mg/dL 11 5(L) 12  Creatinine 0.44 - 1.00 mg/dL 0.86 0.81 0.62  Sodium 135 - 145 mmol/L 135 136 133(L)  Potassium 3.5 - 5.1 mmol/L 4.2 3.9 3.8  Chloride 98 - 111 mmol/L 103 104  103  CO2 22 - 32 mmol/L 24 25 25   Calcium 8.9 - 10.3 mg/dL 9.0 8.7(L) 8.8(L)     Vascular Imaging: ABI 11/12/20  Yevonne Aline. Stanford Breed, MD [facility] Phone Number: [phone] 11/12/2020 1:24 PM

## 2020-12-16 ENCOUNTER — Ambulatory Visit (INDEPENDENT_AMBULATORY_CARE_PROVIDER_SITE_OTHER): Payer: 59 | Admitting: Specialist

## 2020-12-16 ENCOUNTER — Ambulatory Visit (INDEPENDENT_AMBULATORY_CARE_PROVIDER_SITE_OTHER): Payer: 59

## 2020-12-16 ENCOUNTER — Encounter: Payer: Self-pay | Admitting: Specialist

## 2020-12-16 VITALS — BP 108/74 | HR 74 | Ht 62.0 in | Wt 182.0 lb

## 2020-12-16 DIAGNOSIS — M25559 Pain in unspecified hip: Secondary | ICD-10-CM

## 2020-12-16 DIAGNOSIS — M25551 Pain in right hip: Secondary | ICD-10-CM

## 2020-12-16 DIAGNOSIS — M5442 Lumbago with sciatica, left side: Secondary | ICD-10-CM | POA: Diagnosis not present

## 2020-12-16 DIAGNOSIS — M5441 Lumbago with sciatica, right side: Secondary | ICD-10-CM | POA: Diagnosis not present

## 2020-12-16 MED ORDER — GABAPENTIN 100 MG PO CAPS: 100.0000 mg | ORAL_CAPSULE | Freq: Three times a day (TID) | ORAL | 3 refills | Status: DC

## 2020-12-16 MED ORDER — CYCLOBENZAPRINE HCL 10 MG PO TABS: 10.0000 mg | ORAL_TABLET | Freq: Three times a day (TID) | ORAL | 0 refills | Status: AC | PRN

## 2020-12-16 NOTE — Progress Notes (Addendum)
Office Visit Note   Patient: Amber Barrera           Date of Birth: 04/04/1976           MRN: [id number] Visit Date: 12/16/2020              Requested by: Cherre Robins, [address],  [place] [address] PCP: [facility] [place]: Visit Diagnoses:  1. Hip pain   2. Right-sided low back pain with left-sided sciatica, unspecified chronicity   3. Low back pain with right-sided sciatica, unspecified back pain laterality, unspecified chronicity     Plan: Avoid frequent bending and stooping  No lifting greater than 10 lbs. May use ice or moist heat for pain. Weight loss is of benefit. Best medication for lumbar disc disease is arthritis medications like motrin, celebrex and naprosyn. Diclofenac 50 mg po qd for 4 days then one tablet BID with a meal or snack. Stop the meloxicam. Exercise is important to improve your indurance and does allow people to function better inspite of back pain.  Follow-Up Instructions: Return in about 4 weeks (around 01/13/2021).   Orders:  Orders Placed This Encounter  Procedures  . XR HIP UNILAT W OR W/O PELVIS 2-3 VIEWS RIGHT  . XR Lumbar Spine 2-3 Views  . Ambulatory referral to Physical Therapy   Meds ordered this encounter  Medications  . cyclobenzaprine (FLEXERIL) 10 MG tablet    Sig: Take 1 tablet (10 mg total) by mouth 3 (three) times daily as needed for muscle spasms.    Dispense:  30 tablet    Refill:  0  . gabapentin (NEURONTIN) 100 MG capsule    Sig: Take 1 capsule (100 mg total) by mouth 3 (three) times daily.    Dispense:  30 capsule    Refill:  3      Procedures: No procedures performed   Clinical Data: Findings:  CLINICAL DATA: Right groin pain. Previous inguinal hernia repair.  EXAM: MRI PELVIS WITHOUT CONTRAST  TECHNIQUE: Multiplanar multisequence MR imaging of the pelvis was performed. No intravenous contrast was administered.  COMPARISON: None.  FINDINGS: Lower Urinary Tract:  No urinary bladder or urethral abnormality identified.  Bowel: Unremarkable appearance of rectum and other pelvic bowel loops.  Vascular/Lymphatic: Unremarkable. No pathologically enlarged pelvic lymph nodes identified.  Reproductive:  -- Uterus: Prior hysterectomy. Vaginal cuff is unremarkable in appearance.  -- Right ovary: Appears normal. No mass or inflammatory process identified.  -- Left ovary: Appears normal. No mass or inflammatory process identified.  Other: Small amount of free fluid in pelvic cul-de-sac, most likely physiologic in a reproductive age female.  Musculoskeletal: Unremarkable. No evidence of recurrent inguinal hernia. No evidence of inguinal lymphadenopathy, mass, or fluid collections. No osseous abnormality identified.  IMPRESSION: No evidence of recurrent inguinal hernia or groin mass.  Small amount of free fluid in pelvic cul-de-sac, most likely physiologic in a reproductive age female.  Normal ovaries. Prior hysterectomy.   Electronically Signed By: Marlaine Hind M.D. On: 09/12/2019 20:02   Review of the MRI demonstrates subtle changes in the out fibers of the right posterolateral L5-S1 disc on axial T2 images, no significant modic  Changes, no disc herniation noted. No significant disc dessication noted.  CT Scan of the abdomen and pelvis from 4 year ago shows right L5-1 facet arthrosis changes that are moderate. No acute findings.     Subjective: Chief Complaint  Patient presents with  . Right  Hip - Pain    45 year old female with history of back pain that is infrequent related to a 2007 injury when she fell out of a wheel chair and she flipped backwards. She did PT and was better after one month and has occasional episodic. 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 Some pain with disc irritating maneuvers like raking and bending and stooping. Has history of IBS, no bladder difficulty. She takes  meloxicam 15 mg  when there is a flare. Usually after a long day and her whole right leg bothers her. With pain from the right hip down to the right foot. Aching pain burning sensation and with lying on the right side. The bottom of the right foot is throbbing. There is some numbness in the right buttock area and right in the center of the right buttock, bad aching pain. Has to lie on her back or lie on stomach with a pillow under the right hip or around the right leg. She take the meloxicam but in the AM the pain may still there it is an 8-9 of 10 at nighttime. Worst  Pain is 10 sometimes, went to the hospital and the hospital was full and went home and saw her primary care and an MRI of the lumbar spine was done. She does have pain into the right leg all the way down into the right foot.    Review of Systems  Constitutional: Positive for activity change (has to decide based on the hip pain how far she can walk.). Negative for appetite change, chills, diaphoresis, fatigue, fever and unexpected weight change.  HENT: Negative.  Negative for congestion, dental problem, drooling, ear discharge, ear pain, facial swelling, hearing loss, mouth sores, nosebleeds, postnasal drip, rhinorrhea, sinus pressure, sinus pain, sneezing, sore throat, tinnitus, trouble swallowing and voice change.   Eyes: Negative.  Negative for photophobia, pain, discharge, redness, itching and visual disturbance.  Respiratory: Negative.  Negative for apnea, cough, choking, chest tightness, shortness of breath, wheezing and stridor.   Cardiovascular: Negative.  Negative for chest pain, palpitations and leg swelling.  Gastrointestinal: Negative.  Negative for abdominal distention, abdominal pain, anal bleeding, blood in stool, constipation, diarrhea, nausea, rectal pain and vomiting.  Endocrine: Negative for cold intolerance, heat intolerance, polydipsia, polyphagia and polyuria.  Genitourinary: Positive for urgency. Negative for difficulty urinating,  dyspareunia, dysuria, enuresis, flank pain, frequency, genital sores, hematuria and menstrual problem.  Musculoskeletal: Positive for back pain, gait problem and joint swelling.  Skin: Negative.   Allergic/Immunologic: Negative.  Negative for environmental allergies, food allergies and immunocompromised state.  Neurological: Positive for numbness. Negative for dizziness, tremors, seizures, syncope, facial asymmetry, speech difficulty, weakness, light-headedness and headaches.  Hematological: Negative for adenopathy. Does not bruise/bleed easily.  Psychiatric/Behavioral: Negative.  Negative for agitation, behavioral problems, confusion, decreased concentration, dysphoric mood, hallucinations, self-injury, sleep disturbance and suicidal ideas. The patient is not nervous/anxious and is not hyperactive.      Objective: Vital Signs: BP 108/74 (BP Location: Left Arm, Patient Position: Sitting)   Pulse 74   Ht 5\' 2"  (1.575 m)   Wt 182 lb (82.6 kg)   LMP 09/30/2015 Comment: hysterectomy  BMI 33.29 kg/m   Physical Exam Constitutional:      Appearance: She is well-developed and well-nourished.  HENT:     Head: Normocephalic and atraumatic.  Eyes:     Extraocular Movements: EOM normal.     Pupils: Pupils are equal, round, and reactive to light.  Pulmonary:     Effort: Pulmonary effort is normal.     Breath sounds: Normal breath sounds.  Abdominal:     General: Bowel sounds are normal.     Palpations: Abdomen is soft.  Musculoskeletal:     Cervical back: Normal range of motion and neck supple.     Lumbar back: Negative right straight leg raise test and negative left straight leg raise test.  Skin:    General: Skin is warm and dry.  Neurological:     Mental Status: She is alert and oriented to person, place, and time.  Psychiatric:        Mood and Affect: Mood and affect normal.        Behavior: Behavior normal.        Thought Content: Thought content normal.        Judgment: Judgment  normal.     Right Hip Exam   Tenderness  The patient is experiencing tenderness in the anterior and greater trochanter.  Range of Motion  Abduction: normal  Adduction: normal  Extension: normal  Flexion:  120 abnormal  External rotation: normal  Internal rotation: normal   Tests  FABER: negative Ober: negative  Other  Erythema: absent Scars: absent  Comments:  Decreased right hip flexion.    Left Hip Exam   Range of Motion  Flexion: 140   Tests  FABER: negative Ober: negative  Other  Erythema: absent Scars: absent   Back Exam   Tenderness  The patient is experiencing tenderness in the lumbar and sacroiliac.  Range of Motion  Extension:  30 abnormal  Flexion: 70  Lateral bend right: abnormal  Lateral bend left: abnormal  Rotation right: abnormal  Rotation left: abnormal   Muscle Strength  Right Quadriceps:  5/5  Right Hamstrings:  5/5  Left Hamstrings:  5/5   Tests  Straight leg raise right: negative Straight leg raise left: negative  Reflexes  Patellar: 2/4 Achilles: 2/4 Babinski's sign: normal   Other  Toe walk: normal Heel walk: normal Sensation: normal Gait: normal  Erythema: no back redness Scars: absent  Comments:  Right hip flexion is decreased compared with left, IR and ER is painless.        Specialty Comments:  No specialty comments available.  Imaging: No results found.   PMFS History: Patient Active Problem List   Diagnosis Date Noted  . History of hysterectomy for benign disease 04/23/2016  . Left shoulder pain 10/30/2014  . 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 Screening for STD (sexually transmitted disease) 05/18/2013  . Pelvic and perineal pain 02/01/2013  . Pelvic pain in female 02/01/2013   Past Medical History:  Diagnosis Date  . Abdominal pain   . Allergic rhinitis due to pollen   . Anemia    iron deficiency- checked last month- iron supplement started  . Asthma    no problems - in past 3  yrs -no inhalers used  . Change in bowel habits   . Depressed mood   . Dysmenorrhea   . Fatigue   . Fibromyalgia 2021  . GERD (gastroesophageal reflux disease)   . Hypercholesterolemia   . Irregular heartbeat   . Migraines   . Mixed hyperlipidemia   . Myalgia   . Nausea & vomiting   . Pain   . Poor sleep   . Rectal bleeding   . Right leg pain   . SOB (shortness of breath)   . Vitamin D deficiency   . Weakness  Family History  Problem Relation Age of Onset  . Hyperlipidemia Mother   . Hypertension Mother   . Diabetes Mellitus II Father   . Heart disease Maternal Grandmother   . Heart disease Maternal Grandfather   . Colon cancer Maternal Aunt   . Breast cancer Maternal Aunt   . Colon cancer Cousin     Past Surgical History:  Procedure Laterality Date  . ABDOMINAL HYSTERECTOMY    . BUNIONECTOMY Bilateral   . COLONOSCOPY N/A 07/08/2015   Procedure: DIAGNOSTIC COLONOSCOPY;  Surgeon: Leighton Ruff, MD;  Location: [facility];  Service: Endoscopy;  Laterality: N/A;  . HERNIA REPAIR    . TUBAL LIGATION    . WISDOM TOOTH EXTRACTION     Social History   Occupational History  . Not on file  Tobacco Use  . Smoking status: Never Smoker  . Smokeless tobacco: Never Used  Vaping Use  . Vaping Use: Never used  Substance and Sexual Activity  . Alcohol use: Yes    Alcohol/week: 1.0 standard drink    Types: 1 Glasses of wine per week  . Drug use: No  . Sexual activity: Yes    Birth control/protection: Surgical

## 2020-12-16 NOTE — Patient Instructions (Signed)
Avoid frequent bending and stooping  No lifting greater than 10 lbs. May use ice or moist heat for pain. Weight loss is of benefit. Best medication for lumbar disc disease is arthritis medications like motrin, celebrex and naprosyn. Diclofenac 50 mg po qd for 4 days then one tablet BID with a meal or snack. Stop the meloxicam. Exercise is important to improve your indurance and does allow people to function better inspite of back pain.

## 2021-02-06 ENCOUNTER — Ambulatory Visit: Payer: Medicaid Other | Admitting: Podiatry

## 2021-02-13 ENCOUNTER — Ambulatory Visit: Payer: Medicaid Other | Admitting: Podiatry

## 2021-02-17 ENCOUNTER — Other Ambulatory Visit: Payer: Self-pay

## 2021-02-17 ENCOUNTER — Emergency Department (HOSPITAL_BASED_OUTPATIENT_CLINIC_OR_DEPARTMENT_OTHER): Payer: 59

## 2021-02-17 ENCOUNTER — Emergency Department (HOSPITAL_BASED_OUTPATIENT_CLINIC_OR_DEPARTMENT_OTHER)
Admission: EM | Admit: 2021-02-17 | Discharge: 2021-02-17 | Disposition: A | Discharge status: Home / Self Care | Payer: 59 | Attending: Emergency Medicine | Admitting: Emergency Medicine

## 2021-02-17 ENCOUNTER — Encounter (HOSPITAL_BASED_OUTPATIENT_CLINIC_OR_DEPARTMENT_OTHER): Payer: Self-pay

## 2021-02-17 DIAGNOSIS — M546 Pain in thoracic spine: Secondary | ICD-10-CM | POA: Insufficient documentation

## 2021-02-17 DIAGNOSIS — R109 Unspecified abdominal pain: Secondary | ICD-10-CM | POA: Insufficient documentation

## 2021-02-17 DIAGNOSIS — Z7952 Long term (current) use of systemic steroids: Secondary | ICD-10-CM | POA: Diagnosis not present

## 2021-02-17 DIAGNOSIS — B349 Viral infection, unspecified: Secondary | ICD-10-CM

## 2021-02-17 DIAGNOSIS — Z91040 Latex allergy status: Secondary | ICD-10-CM | POA: Diagnosis not present

## 2021-02-17 DIAGNOSIS — M545 Low back pain, unspecified: Secondary | ICD-10-CM | POA: Insufficient documentation

## 2021-02-17 DIAGNOSIS — U071 COVID-19: Secondary | ICD-10-CM | POA: Diagnosis not present

## 2021-02-17 DIAGNOSIS — R52 Pain, unspecified: Secondary | ICD-10-CM

## 2021-02-17 DIAGNOSIS — R0602 Shortness of breath: Secondary | ICD-10-CM | POA: Diagnosis present

## 2021-02-17 DIAGNOSIS — J45909 Unspecified asthma, uncomplicated: Secondary | ICD-10-CM | POA: Diagnosis not present

## 2021-02-17 DIAGNOSIS — R059 Cough, unspecified: Secondary | ICD-10-CM

## 2021-02-17 DIAGNOSIS — Z20822 Contact with and (suspected) exposure to covid-19: Secondary | ICD-10-CM

## 2021-02-17 LAB — CBC WITH DIFFERENTIAL/PLATELET
Abs Immature Granulocytes: 0.01 10*3/uL (ref 0.00–0.07)
Basophils Absolute: 0 10*3/uL (ref 0.0–0.1)
Basophils Relative: 1 %
Eosinophils Absolute: 0.2 10*3/uL (ref 0.0–0.5)
Eosinophils Relative: 5 %
HCT: 36.1 % (ref 36.0–46.0)
Hemoglobin: 11.2 g/dL — ABNORMAL LOW (ref 12.0–15.0)
Immature Granulocytes: 0 %
Lymphocytes Relative: 36 %
Lymphs Abs: 1.3 10*3/uL (ref 0.7–4.0)
MCH: 22.1 pg — ABNORMAL LOW (ref 26.0–34.0)
MCHC: 31 g/dL (ref 30.0–36.0)
MCV: 71.2 fL — ABNORMAL LOW (ref 80.0–100.0)
Monocytes Absolute: 0.6 10*3/uL (ref 0.1–1.0)
Monocytes Relative: 16 %
Neutro Abs: 1.6 10*3/uL — ABNORMAL LOW (ref 1.7–7.7)
Neutrophils Relative %: 42 %
Platelets: 217 10*3/uL (ref 150–400)
RBC: 5.07 MIL/uL (ref 3.87–5.11)
RDW: 14.7 % (ref 11.5–15.5)
WBC: 3.7 10*3/uL — ABNORMAL LOW (ref 4.0–10.5)
nRBC: 0 % (ref 0.0–0.2)

## 2021-02-17 LAB — URINALYSIS, ROUTINE W REFLEX MICROSCOPIC
Bilirubin Urine: NEGATIVE
Glucose, UA: NEGATIVE mg/dL
Ketones, ur: NEGATIVE mg/dL
Leukocytes,Ua: NEGATIVE
Nitrite: NEGATIVE
Protein, ur: NEGATIVE mg/dL
Specific Gravity, Urine: <1.005 — ABNORMAL LOW (ref 1.005–1.030)
pH: 6 (ref 5.0–8.0)

## 2021-02-17 LAB — COMPREHENSIVE METABOLIC PANEL
ALT: 11 U/L (ref 0–44)
AST: 18 U/L (ref 15–41)
Albumin: 3.5 g/dL (ref 3.5–5.0)
Alkaline Phosphatase: 41 U/L (ref 38–126)
Anion gap: 10 (ref 5–15)
BUN: 8 mg/dL (ref 6–20)
CO2: 22 mmol/L (ref 22–32)
Calcium: 8.5 mg/dL — ABNORMAL LOW (ref 8.9–10.3)
Chloride: 102 mmol/L (ref 98–111)
Creatinine, Ser: 0.84 mg/dL (ref 0.44–1.00)
GFR, Estimated: >60 mL/min (ref >60)
Glucose, Bld: 103 mg/dL — ABNORMAL HIGH (ref 70–99)
Potassium: 3.1 mmol/L — ABNORMAL LOW (ref 3.5–5.1)
Sodium: 134 mmol/L — ABNORMAL LOW (ref 135–145)
Total Bilirubin: 0.4 mg/dL (ref 0.3–1.2)
Total Protein: 7.4 g/dL (ref 6.5–8.1)

## 2021-02-17 LAB — URINALYSIS, MICROSCOPIC (REFLEX)

## 2021-02-17 LAB — SARS CORONAVIRUS 2 (TAT 6-24 HRS): SARS Coronavirus 2: POSITIVE — AB

## 2021-02-17 MED ORDER — POTASSIUM CHLORIDE CRYS ER 20 MEQ PO TBCR
40.0000 meq | EXTENDED_RELEASE_TABLET | Freq: Once | ORAL | Status: AC
  Administered 2021-02-17: 40 meq via ORAL
  Filled 2021-02-17: qty 2

## 2021-02-17 MED ORDER — SODIUM CHLORIDE 0.9 % IV BOLUS
1000.0000 mL | Freq: Once | INTRAVENOUS | Status: AC
  Administered 2021-02-17: 1000 mL via INTRAVENOUS

## 2021-02-17 NOTE — ED Triage Notes (Signed)
SOB with dry, non-productive  cough x 2 days with some post nasal drip.  N/D & body aches (back pain mostly).  Decreased UOP over the last couple of days, pt states "I feel dehydrated".

## 2021-02-17 NOTE — ED Notes (Signed)
Report to Edna Bay, RN

## 2021-02-17 NOTE — ED Provider Notes (Signed)
[facility] EMERGENCY DEPARTMENT Provider Note   CSN: [id number] Arrival date & time: 02/17/21  9242     History Chief Complaint  Patient presents with  . Shortness of Breath    Amber Barrera is a 45 y.o. female.  HPI      Thursday AM having diarrhea, nasal congestion to right side of face, as day went on began to have some congestion, Thursday evening kept clearing throat, drainage, Friday was feeling low energy, had COVID swab rapid test, works in Cardiac unit No fever at the time, as day went on on Friday developed fever, no loss of taste, fatigue and fever. Woke up Saturday felt ok, then had fatigue, started losing voice by Saturday evening, coughing a lot yesterday, tried to push fluids, body felt more achy, UOP decreasing, not eating or drinking. 100.0 measured temp at home.   No known sick contacts, has not been vaccinated against [employer], had it in August  No dysuria, Has had back pain as a part of body aches, low and upper, also flank pain, some abdominal pain tightness Nausea, no vomiting No continuing diarrhea Mild sore throat   Past Medical History:  Diagnosis Date  . Abdominal pain   . Allergic rhinitis due to pollen   . Anemia    iron deficiency- checked last month- iron supplement started  . Asthma    no problems - in past 3 yrs -no inhalers used  . Change in bowel habits   . Depressed mood   . Dysmenorrhea   . Fatigue   . Fibromyalgia 2021  . GERD (gastroesophageal reflux disease)   . Hypercholesterolemia   . Irregular heartbeat   . Migraines   . Mixed hyperlipidemia   . Myalgia   . Nausea & vomiting   . Pain   . Poor sleep   . Rectal bleeding   . Right leg pain   . SOB (shortness of breath)   . Vitamin D deficiency   . Weakness     Patient Active Problem List   Diagnosis Date Noted  . History of hysterectomy for benign disease 04/23/2016  . Left shoulder pain 10/30/2014  . 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 Screening for STD  (sexually transmitted disease) 05/18/2013  . Pelvic and perineal pain 02/01/2013  . Pelvic pain in female 02/01/2013    Past Surgical History:  Procedure Laterality Date  . ABDOMINAL HYSTERECTOMY    . BUNIONECTOMY Bilateral   . COLONOSCOPY N/A 07/08/2015   Procedure: DIAGNOSTIC COLONOSCOPY;  Surgeon: Leighton Ruff, MD;  Location: [facility];  Service: Endoscopy;  Laterality: N/A;  . HERNIA REPAIR    . TUBAL LIGATION    . WISDOM TOOTH EXTRACTION       OB History   No obstetric history on file.     Family History  Problem Relation Age of Onset  . Hyperlipidemia Mother   . Hypertension Mother   . Diabetes Mellitus II Father   . Heart disease Maternal Grandmother   . Heart disease Maternal Grandfather   . Colon cancer Maternal Aunt   . Breast cancer Maternal Aunt   . Colon cancer Cousin     Social History   Tobacco Use  . Smoking status: Never Smoker  . Smokeless tobacco: Never Used  Vaping Use  . Vaping Use: Never used  Substance Use Topics  . Alcohol use: Yes    Alcohol/week: 1.0 standard drink    Types: 1 Glasses of wine per week  .  Drug use: No    Home Medications Prior to Admission medications   Medication Sig Start Date End Date Taking? Authorizing Provider  eszopiclone (LUNESTA) 2 MG TABS tablet Take 2 mg by mouth at bedtime as needed for sleep. Take immediately before bedtime   Yes [provider]  ibuprofen (ADVIL) 600 MG tablet Take 600 mg by mouth every 6 (six) hours as needed. 09/18/20  Yes [provider]  meloxicam (MOBIC) 15 MG tablet Take 15 mg by mouth daily. 08/30/20  Yes [provider]  cyclobenzaprine (FLEXERIL) 10 MG tablet Take 1 tablet (10 mg total) by mouth 3 (three) times daily as needed for muscle spasms. 12/16/20   Jessy Oto, MD  DULoxetine (CYMBALTA) 30 MG capsule Take 30 mg by mouth daily. 10/22/20   [provider]  FEROSUL 325 (65 Fe) MG tablet Take 325 mg by mouth daily. 05/30/20   [provider]  gabapentin (NEURONTIN) 100 MG capsule Take 1 capsule (100 mg total) by mouth 3 (three) times daily. 12/16/20   Jessy Oto, MD  LINZESS 290 MCG CAPS capsule Take 290 mcg by mouth daily. 05/26/20   [provider]  magnesium oxide (MAG-OX) 400 (241.3 Mg) MG tablet Take 1 tablet by mouth daily. 06/24/20   [provider]  PROAIR HFA 108 (90 Base) MCG/ACT inhaler Inhale into the lungs. 06/19/20   [provider]    Allergies    Latex, Reglan [metoclopramide], and Tape  Review of Systems   Review of Systems  Constitutional: Positive for appetite change, chills, fatigue and fever.  HENT: Positive for congestion, rhinorrhea and sore throat.   Eyes: Positive for visual disturbance (can read big letters but not small eyes crusty and puffy).  Respiratory: Positive for cough and chest tightness. Negative for shortness of breath.   Cardiovascular: Negative for chest pain.  Gastrointestinal: Positive for diarrhea, nausea and vomiting. Negative for abdominal pain.  Genitourinary: Positive for decreased urine volume. Negative for difficulty urinating and dysuria.  Musculoskeletal: Positive for arthralgias and myalgias. Negative for back pain.  Skin: Negative for rash and wound.  Neurological: Negative for syncope and headaches.    Physical Exam Updated Vital Signs BP 96/64 (BP Location: Left Arm)   Pulse 68   Temp 98.2 F (36.8 C) (Oral)   Resp 19   Ht 5\' 2"  (1.575 m)   Wt 84.4 kg   LMP 09/30/2015 Comment: hysterectomy  SpO2 100%   BMI 34.02 kg/m   Physical Exam Vitals and nursing note reviewed.  Constitutional:      General: She is not in acute distress.    Appearance: She is well-developed. She is not diaphoretic.  HENT:     Head: Normocephalic and atraumatic.  Eyes:     Conjunctiva/sclera: Conjunctivae normal.  Cardiovascular:     Rate and Rhythm: Normal rate and regular rhythm.     Heart sounds: Normal heart sounds. No murmur heard. No  friction rub. No gallop.   Pulmonary:     Effort: Pulmonary effort is normal. No respiratory distress.     Breath sounds: Normal breath sounds. No wheezing or rales.  Abdominal:     General: There is no distension.     Palpations: Abdomen is soft.     Tenderness: There is no abdominal tenderness. There is no guarding.  Musculoskeletal:        General: No tenderness.     Cervical back: Normal range of motion.  Skin:    General:  Skin is warm and dry.     Findings: No erythema or rash.  Neurological:     Mental Status: She is alert and oriented to person, place, and time.     ED Results / Procedures / Treatments   Labs (all labs ordered are listed, but only abnormal results are displayed) Labs Reviewed  SARS CORONAVIRUS 2 (TAT 6-24 HRS) - Abnormal; Notable for the following components:      Result Value   SARS Coronavirus 2 POSITIVE (*)    All other components within normal limits  CBC WITH DIFFERENTIAL/PLATELET - Abnormal; Notable for the following components:   WBC 3.7 (*)    Hemoglobin 11.2 (*)    MCV 71.2 (*)    MCH 22.1 (*)    Neutro Abs 1.6 (*)    All other components within normal limits  COMPREHENSIVE METABOLIC PANEL - Abnormal; Notable for the following components:   Sodium 134 (*)    Potassium 3.1 (*)    Glucose, Bld 103 (*)    Calcium 8.5 (*)    All other components within normal limits  URINALYSIS, ROUTINE W REFLEX MICROSCOPIC - Abnormal; Notable for the following components:   Specific Gravity, Urine <1.005 (*)    Hgb urine dipstick TRACE (*)    All other components within normal limits  URINALYSIS, MICROSCOPIC (REFLEX) - Abnormal; Notable for the following components:   Bacteria, UA RARE (*)    All other components within normal limits  URINE CULTURE    EKG EKG Interpretation  Date/Time:  Monday February 17 2021 10:35:53 EDT Ventricular Rate:  67 PR Interval:    QRS Duration: 86 QT Interval:  421 QTC Calculation: 445 R Axis:   43 Text  Interpretation: Sinus rhythm Prolonged PR interval Low voltage, precordial leads Abnormal R-wave progression, early transition Borderline T abnormalities, anterior leads No significant change since last tracing Confirmed by Gareth Morgan [phone]) on 02/17/2021 10:40:17 AM   Radiology DG Chest Portable 1 View  Result Date: 02/17/2021 CLINICAL DATA:  Shortness of breath, cough, postnasal drip. Body aches. EXAM: PORTABLE CHEST 1 VIEW COMPARISON:  06/26/2019. FINDINGS: Trachea is midline. Heart is at the upper limits of normal in size and accentuated by AP semi upright technique. Lungs are clear. No pleural fluid. IMPRESSION: No acute findings. Electronically Signed   By: Lorin Picket M.D.   On: 02/17/2021 09:52    Procedures Procedures   Medications Ordered in ED Medications  sodium chloride 0.9 % bolus 1,000 mL (0 mLs Intravenous Stopped 02/17/21 1137)  potassium chloride SA (KLOR-CON) CR tablet 40 mEq (40 mEq Oral Given 02/17/21 1040)    ED Course  I have reviewed the triage vital signs and the nursing notes.  Pertinent labs & imaging results that were available during my care of the patient were reviewed by me and considered in my medical decision making (see chart for details).    MDM Rules/Calculators/A&P                          45yo female presents with concern for nasal congestion, cough, diarrhea.  CXR without pneumonia or other acute abnormalities.  K 3.1, given K. No sign of UTI.  Has not been eating or drinking well, given IV fluids.  Suspect viral syndrome given combination of symptoms, COVID testing pending.  Recommend supportive care, follow up on results.    Final Clinical Impression(s) / ED Diagnoses Final diagnoses:  Viral syndrome  COVID-19 virus test  result unknown  Cough  Body aches    Rx / DC Orders ED Discharge Orders    None       Gareth Morgan, MD 02/18/21 [id number]

## 2021-02-18 ENCOUNTER — Telehealth: Payer: Self-pay

## 2021-02-18 LAB — URINE CULTURE: Culture: <10000 — AB

## 2021-02-18 NOTE — Telephone Encounter (Signed)
Called to discuss with patient about COVID-19 symptoms and the use of one of the available treatments for those with mild to moderate Covid symptoms and at a high risk of hospitalization.  Pt appears to qualify for outpatient treatment due to co-morbid conditions and/or a member of an at-risk group in accordance with the FDA Emergency Use Authorization.    Symptom onset: 02/14/21 Vaccinated: No Booster? No Immunocompromised? No Qualifiers: Asthma  Pt. Declines any further treatment.   Marcello Moores

## 2021-11-04 ENCOUNTER — Other Ambulatory Visit: Payer: Self-pay | Admitting: Nurse Practitioner

## 2021-11-04 DIAGNOSIS — K4090 Unilateral inguinal hernia, without obstruction or gangrene, not specified as recurrent: Secondary | ICD-10-CM

## 2021-11-22 ENCOUNTER — Inpatient Hospital Stay: Admission: RE | Admit: 2021-11-22 | Payer: 59 | Source: Ambulatory Visit

## 2021-11-28 ENCOUNTER — Ambulatory Visit
Admission: RE | Admit: 2021-11-28 | Discharge: 2021-11-28 | Disposition: A | Discharge status: Home / Self Care | Payer: 59 | Source: Ambulatory Visit | Attending: Nurse Practitioner | Admitting: Nurse Practitioner

## 2021-11-28 ENCOUNTER — Other Ambulatory Visit: Payer: Self-pay

## 2021-11-28 DIAGNOSIS — K4090 Unilateral inguinal hernia, without obstruction or gangrene, not specified as recurrent: Secondary | ICD-10-CM

## 2021-12-11 ENCOUNTER — Other Ambulatory Visit: Payer: Self-pay | Admitting: Family Medicine

## 2021-12-11 DIAGNOSIS — R42 Dizziness and giddiness: Secondary | ICD-10-CM

## 2021-12-27 ENCOUNTER — Other Ambulatory Visit: Payer: 59

## 2022-04-05 ENCOUNTER — Other Ambulatory Visit: Payer: Self-pay

## 2022-04-05 ENCOUNTER — Encounter (HOSPITAL_BASED_OUTPATIENT_CLINIC_OR_DEPARTMENT_OTHER): Payer: Self-pay | Admitting: Emergency Medicine

## 2022-04-05 ENCOUNTER — Emergency Department (HOSPITAL_BASED_OUTPATIENT_CLINIC_OR_DEPARTMENT_OTHER): Payer: 59

## 2022-04-05 ENCOUNTER — Emergency Department (HOSPITAL_BASED_OUTPATIENT_CLINIC_OR_DEPARTMENT_OTHER)
Admission: EM | Admit: 2022-04-05 | Discharge: 2022-04-05 | Disposition: A | Discharge status: Home / Self Care | Payer: 59 | Attending: Emergency Medicine | Admitting: Emergency Medicine

## 2022-04-05 DIAGNOSIS — M545 Low back pain, unspecified: Secondary | ICD-10-CM | POA: Insufficient documentation

## 2022-04-05 DIAGNOSIS — R1030 Lower abdominal pain, unspecified: Secondary | ICD-10-CM | POA: Insufficient documentation

## 2022-04-05 DIAGNOSIS — Z91040 Latex allergy status: Secondary | ICD-10-CM | POA: Insufficient documentation

## 2022-04-05 DIAGNOSIS — R8271 Bacteriuria: Secondary | ICD-10-CM | POA: Insufficient documentation

## 2022-04-05 LAB — CBC WITH DIFFERENTIAL/PLATELET
Abs Immature Granulocytes: 0.02 10*3/uL (ref 0.00–0.07)
Basophils Absolute: 0.1 10*3/uL (ref 0.0–0.1)
Basophils Relative: 1 %
Eosinophils Absolute: 0.3 10*3/uL (ref 0.0–0.5)
Eosinophils Relative: 4 %
HCT: 35.2 % — ABNORMAL LOW (ref 36.0–46.0)
Hemoglobin: 11 g/dL — ABNORMAL LOW (ref 12.0–15.0)
Immature Granulocytes: 0 %
Lymphocytes Relative: 32 %
Lymphs Abs: 1.8 10*3/uL (ref 0.7–4.0)
MCH: 22.2 pg — ABNORMAL LOW (ref 26.0–34.0)
MCHC: 31.3 g/dL (ref 30.0–36.0)
MCV: 71 fL — ABNORMAL LOW (ref 80.0–100.0)
Monocytes Absolute: 0.5 10*3/uL (ref 0.1–1.0)
Monocytes Relative: 10 %
Neutro Abs: 2.9 10*3/uL (ref 1.7–7.7)
Neutrophils Relative %: 53 %
Platelets: 230 10*3/uL (ref 150–400)
RBC: 4.96 MIL/uL (ref 3.87–5.11)
RDW: 14.6 % (ref 11.5–15.5)
WBC: 5.6 10*3/uL (ref 4.0–10.5)
nRBC: 0 % (ref 0.0–0.2)

## 2022-04-05 LAB — URINALYSIS, ROUTINE W REFLEX MICROSCOPIC
Bilirubin Urine: NEGATIVE
Glucose, UA: NEGATIVE mg/dL
Ketones, ur: NEGATIVE mg/dL
Leukocytes,Ua: NEGATIVE
Nitrite: NEGATIVE
Protein, ur: NEGATIVE mg/dL
Specific Gravity, Urine: 1.025 (ref 1.005–1.030)
pH: 6.5 (ref 5.0–8.0)

## 2022-04-05 LAB — BASIC METABOLIC PANEL
Anion gap: 7 (ref 5–15)
BUN: 13 mg/dL (ref 6–20)
CO2: 25 mmol/L (ref 22–32)
Calcium: 8.8 mg/dL — ABNORMAL LOW (ref 8.9–10.3)
Chloride: 103 mmol/L (ref 98–111)
Creatinine, Ser: 0.81 mg/dL (ref 0.44–1.00)
GFR, Estimated: >60 mL/min (ref >60)
Glucose, Bld: 93 mg/dL (ref 70–99)
Potassium: 3.8 mmol/L (ref 3.5–5.1)
Sodium: 135 mmol/L (ref 135–145)

## 2022-04-05 LAB — URINALYSIS, MICROSCOPIC (REFLEX)

## 2022-04-05 MED ORDER — CEPHALEXIN 500 MG PO CAPS: 500.0000 mg | ORAL_CAPSULE | Freq: Three times a day (TID) | ORAL | 0 refills | Status: AC

## 2022-04-05 MED ORDER — KETOROLAC TROMETHAMINE 15 MG/ML IJ SOLN
15.0000 mg | Freq: Once | INTRAMUSCULAR | Status: AC
  Administered 2022-04-05: 15 mg via INTRAVENOUS
  Filled 2022-04-05: qty 1

## 2022-04-05 NOTE — Discharge Instructions (Addendum)
Call your primary care doctor or specialist as discussed in the next 2-3 days.   Return immediately back to the ER if:  Your symptoms worsen within the next 12-24 hours. You develop new symptoms such as new fevers, persistent vomiting, new pain, shortness of breath, or new weakness or numbness, or if you have any other concerns.  

## 2022-04-05 NOTE — ED Provider Notes (Signed)
?[facility] EMERGENCY DEPARTMENT ?Provider Note ? ? ?CSN: [id number] ?Arrival date & time: 04/05/22  2992 ? ?  ? ?History ? ?Chief Complaint  ?Patient presents with  ? Back Pain  ? ? ?Amber Barrera is a 46 y.o. female. ? ?Patient presents with chief complaint of left-sided lower back pain.  Describes a dull ache worse when she moves a certain way and twists her back a certain way.  Denies any fall or trauma, denies any dysuria or urinary frequency.  Denies fevers or cough or vomiting or diarrhea.  Symptoms ongoing for 1 day. ? ? ?  ? ?Home Medications ?Prior to Admission medications   ?Medication Sig Start Date End Date Taking? Authorizing Provider  ?cephALEXin (KEFLEX) 500 MG capsule Take 1 capsule (500 mg total) by mouth 3 (three) times daily for 5 days. 04/05/22 04/10/22 Yes Luna Fuse, MD  ?magnesium oxide (MAG-OX) 400 (241.3 Mg) MG tablet Take 1 tablet by mouth daily. 06/24/20  Yes [provider]  ?cyclobenzaprine (FLEXERIL) 10 MG tablet Take 1 tablet (10 mg total) by mouth 3 (three) times daily as needed for muscle spasms. 12/16/20   Jessy Oto, MD  ?DULoxetine (CYMBALTA) 30 MG capsule Take 30 mg by mouth daily. 10/22/20   [provider]  ?eszopiclone (LUNESTA) 2 MG TABS tablet Take 2 mg by mouth at bedtime as needed for sleep. Take immediately before bedtime    [provider]  ?FEROSUL 325 (65 Fe) MG tablet Take 325 mg by mouth daily. 05/30/20   [provider]  ?gabapentin (NEURONTIN) 100 MG capsule Take 1 capsule (100 mg total) by mouth 3 (three) times daily. 12/16/20   Jessy Oto, MD  ?ibuprofen (ADVIL) 600 MG tablet Take 600 mg by mouth every 6 (six) hours as needed. 09/18/20   [provider]  ?Rolan Lipa 290 MCG CAPS capsule Take 290 mcg by mouth daily. 05/26/20   [provider]  ?meloxicam (MOBIC) 15 MG tablet Take 15 mg by mouth daily. 08/30/20   [provider]  ?pregabalin (LYRICA) 25 MG capsule Take 25 mg by mouth daily as  needed.    [provider]  ?PROAIR HFA 108 [phone] Base) MCG/ACT inhaler Inhale into the lungs. 06/19/20   [provider]  ?   ? ?Allergies    ?Latex, Reglan [metoclopramide], and Tape   ? ?Review of Systems   ?Review of Systems  ?Constitutional:  Negative for fever.  ?HENT:  Negative for ear pain.   ?Eyes:  Negative for pain.  ?Respiratory:  Negative for cough.   ?Cardiovascular:  Negative for chest pain.  ?Gastrointestinal:  Negative for abdominal pain.  ?Genitourinary:  Negative for dysuria.  ?Musculoskeletal:  Positive for back pain.  ?Skin:  Negative for rash.  ?Neurological:  Negative for headaches.  ? ?Physical Exam ?Updated Vital Signs ?BP 102/70   Pulse 65   Temp 98.2 ?F (36.8 ?C) (Oral)   Resp 16   Ht '5\' 2"'$  (1.575 m)   Wt 83.5 kg   LMP 09/30/2015 Comment: hysterectomy  SpO2 100%   BMI 33.65 kg/m?  ?Physical Exam ?Constitutional:   ?   General: She is not in acute distress. ?   Appearance: Normal appearance.  ?HENT:  ?   Head: Normocephalic.  ?   Nose: Nose normal.  ?Eyes:  ?   Extraocular Movements: Extraocular movements intact.  ?Cardiovascular:  ?   Rate and Rhythm: Normal rate.  ?Pulmonary:  ?   Effort: Pulmonary  effort is normal.  ?Musculoskeletal:     ?   General: Normal range of motion.  ?   Cervical back: Normal range of motion.  ?   Comments: No C or T or L-spine midline step-offs or tenderness.  Patient has mild L1-L2 left paraspinal tenderness noted.  Mild left-sided CVA tenderness noted.  Otherwise able to flex and turn her torso and bend over and touch her toes with minimal discomfort.  Normal gait.  ?Neurological:  ?   General: No focal deficit present.  ?   Mental Status: She is alert. Mental status is at baseline.  ? ? ?ED Results / Procedures / Treatments   ?Labs ?(all labs ordered are listed, but only abnormal results are displayed) ?Labs Reviewed  ?CBC WITH DIFFERENTIAL/PLATELET - Abnormal; Notable for the following components:  ?    Result Value  ? Hemoglobin 11.0  (*)   ? HCT 35.2 (*)   ? MCV 71.0 (*)   ? MCH 22.2 (*)   ? All other components within normal limits  ?BASIC METABOLIC PANEL - Abnormal; Notable for the following components:  ? Calcium 8.8 (*)   ? All other components within normal limits  ?URINALYSIS, ROUTINE W REFLEX MICROSCOPIC - Abnormal; Notable for the following components:  ? Hgb urine dipstick TRACE (*)   ? All other components within normal limits  ?URINALYSIS, MICROSCOPIC (REFLEX) - Abnormal; Notable for the following components:  ? Bacteria, UA FEW (*)   ? All other components within normal limits  ?URINE CULTURE  ? ? ?EKG ?None ? ?Radiology ?CT Renal Stone Study ? ?Result Date: 04/05/2022 ?CLINICAL DATA:  Left low back pain and flank pain. EXAM: CT ABDOMEN AND PELVIS WITHOUT CONTRAST TECHNIQUE: Multidetector CT imaging of the abdomen and pelvis was performed following the standard protocol without IV contrast. RADIATION DOSE REDUCTION: This exam was performed according to the departmental dose-optimization program which includes automated exposure control, adjustment of the mA and/or kV according to patient size and/or use of iterative reconstruction technique. COMPARISON:  10/04/2016 FINDINGS: Lower chest: Unremarkable Hepatobiliary: No suspicious focal abnormality in the liver on this study without intravenous contrast. There is no evidence for gallstones, gallbladder wall thickening, or pericholecystic fluid. No intrahepatic or extrahepatic biliary dilation. Pancreas: No focal mass lesion. No dilatation of the main duct. No intraparenchymal cyst. No peripancreatic edema. Spleen: No splenomegaly. No focal mass lesion. Adrenals/Urinary Tract: No adrenal nodule or mass. Kidneys unremarkable No evidence for hydroureter. Bladder is decompressed. Stomach/Bowel: Stomach is unremarkable. No gastric wall thickening. No evidence of outlet obstruction. Duodenum is normally positioned as is the ligament of Treitz. No small bowel wall thickening. No small bowel  dilatation. The terminal ileum is normal. The appendix is normal. No gross colonic mass. No colonic wall thickening. Vascular/Lymphatic: No abdominal aortic aneurysm. There is no gastrohepatic or hepatoduodenal ligament lymphadenopathy. No retroperitoneal or mesenteric lymphadenopathy. No pelvic sidewall lymphadenopathy. Reproductive: The uterus is surgically absent. There is no adnexal mass. Other: Trace free fluid in the cul-de-sac, likely physiologic. Musculoskeletal: No worrisome lytic or sclerotic osseous abnormality. IMPRESSION: No acute findings in the abdomen or pelvis. Specifically, no findings to explain the patient's history of left flank pain. Electronically Signed   By: Misty Stanley M.D.   On: 04/05/2022 08:52   ? ?Procedures ?Procedures  ? ? ?Medications Ordered in ED ?Medications  ?ketorolac (TORADOL) 15 MG/ML injection 15 mg (15 mg Intravenous Given 04/05/22 0824)  ? ? ?ED Course/ Medical Decision Making/ A&P ?  ?                        ?  Medical Decision Making ?Amount and/or Complexity of Data Reviewed ?Labs: ordered. ?Radiology: ordered. ? ?Risk ?Prescription drug management. ? ? ?Cardiac monitor shows sinus rhythm. ? ?Chart review shows outpatient visit April 01, 2022 for dizziness. ? ?Work-up today included labs CBC chemistry which were all unremarkable.  Urinalysis shows few bacteria.  Negative nitrates negative leukocytes.  CT abdomen pelvis shows no evidence of kidney stone unremarkable.  Patient given Toradol with improvement of symptoms. ? ?Etiology of her symptoms unclear.  She does have few bacteria in her urine which I elected to treat given her symptoms.  Advised outpatient follow-up with her doctor this week.  Advising immediate return for fevers worsening symptoms or any additional concerns.  Has no red flag signs.  No weight loss, no history of IV drug use, no new numbness or weakness.  No bowel or bladder dysfunction. ? ? ? ? ? ? ? ?Final Clinical Impression(s) / ED Diagnoses ?Final  diagnoses:  ?Acute left-sided low back pain without sciatica  ? ? ?Rx / DC Orders ?ED Discharge Orders   ? ?      Ordered  ?  cephALEXin (KEFLEX) 500 MG capsule  3 times daily       ? 04/05/22 3343  ? ?  ?  ?

## 2022-04-05 NOTE — ED Notes (Signed)
ED Provider at bedside. 

## 2022-04-05 NOTE — ED Triage Notes (Signed)
Patient states that she is having pain on her left lower back.Patient report a dull pain.  ?

## 2022-04-06 LAB — URINE CULTURE: Culture: NO GROWTH

## 2022-07-26 ENCOUNTER — Emergency Department (HOSPITAL_BASED_OUTPATIENT_CLINIC_OR_DEPARTMENT_OTHER)
Admission: EM | Admit: 2022-07-26 | Discharge: 2022-07-26 | Disposition: A | Discharge status: Home / Self Care | Payer: 59 | Attending: Emergency Medicine | Admitting: Emergency Medicine

## 2022-07-26 ENCOUNTER — Encounter (HOSPITAL_BASED_OUTPATIENT_CLINIC_OR_DEPARTMENT_OTHER): Payer: Self-pay | Admitting: Emergency Medicine

## 2022-07-26 ENCOUNTER — Other Ambulatory Visit: Payer: Self-pay

## 2022-07-26 ENCOUNTER — Emergency Department (HOSPITAL_COMMUNITY): Payer: 59

## 2022-07-26 ENCOUNTER — Emergency Department (HOSPITAL_BASED_OUTPATIENT_CLINIC_OR_DEPARTMENT_OTHER): Payer: 59

## 2022-07-26 DIAGNOSIS — J45909 Unspecified asthma, uncomplicated: Secondary | ICD-10-CM | POA: Diagnosis not present

## 2022-07-26 DIAGNOSIS — W01198A Fall on same level from slipping, tripping and stumbling with subsequent striking against other object, initial encounter: Secondary | ICD-10-CM | POA: Insufficient documentation

## 2022-07-26 DIAGNOSIS — R42 Dizziness and giddiness: Secondary | ICD-10-CM | POA: Insufficient documentation

## 2022-07-26 DIAGNOSIS — R531 Weakness: Secondary | ICD-10-CM | POA: Insufficient documentation

## 2022-07-26 DIAGNOSIS — Z91040 Latex allergy status: Secondary | ICD-10-CM | POA: Insufficient documentation

## 2022-07-26 LAB — URINALYSIS, MICROSCOPIC (REFLEX)

## 2022-07-26 LAB — URINALYSIS, ROUTINE W REFLEX MICROSCOPIC
Bilirubin Urine: NEGATIVE
Glucose, UA: NEGATIVE mg/dL
Ketones, ur: NEGATIVE mg/dL
Leukocytes,Ua: NEGATIVE
Nitrite: NEGATIVE
Protein, ur: NEGATIVE mg/dL
Specific Gravity, Urine: 1.02 (ref 1.005–1.030)
pH: 7 (ref 5.0–8.0)

## 2022-07-26 LAB — BASIC METABOLIC PANEL
Anion gap: 5 (ref 5–15)
BUN: 12 mg/dL (ref 6–20)
CO2: 26 mmol/L (ref 22–32)
Calcium: 8.5 mg/dL — ABNORMAL LOW (ref 8.9–10.3)
Chloride: 106 mmol/L (ref 98–111)
Creatinine, Ser: 0.74 mg/dL (ref 0.44–1.00)
GFR, Estimated: >60 mL/min (ref >60)
Glucose, Bld: 97 mg/dL (ref 70–99)
Potassium: 3.5 mmol/L (ref 3.5–5.1)
Sodium: 137 mmol/L (ref 135–145)

## 2022-07-26 LAB — CBC
HCT: 34.7 % — ABNORMAL LOW (ref 36.0–46.0)
Hemoglobin: 10.8 g/dL — ABNORMAL LOW (ref 12.0–15.0)
MCH: 21.9 pg — ABNORMAL LOW (ref 26.0–34.0)
MCHC: 31.1 g/dL (ref 30.0–36.0)
MCV: 70.4 fL — ABNORMAL LOW (ref 80.0–100.0)
Platelets: 238 10*3/uL (ref 150–400)
RBC: 4.93 MIL/uL (ref 3.87–5.11)
RDW: 14.8 % (ref 11.5–15.5)
WBC: 5.3 10*3/uL (ref 4.0–10.5)
nRBC: 0 % (ref 0.0–0.2)

## 2022-07-26 LAB — MAGNESIUM: Magnesium: 1.8 mg/dL (ref 1.7–2.4)

## 2022-07-26 MED ORDER — GADOBUTROL 1 MMOL/ML IV SOLN
8.0000 mL | Freq: Once | INTRAVENOUS | Status: AC | PRN
Start: 2022-07-26 — End: 2022-07-26
  Administered 2022-07-26: 8 mL via INTRAVENOUS

## 2022-07-26 MED ORDER — MECLIZINE HCL 25 MG PO TABS
25.0000 mg | ORAL_TABLET | Freq: Once | ORAL | Status: AC
  Administered 2022-07-26: 25 mg via ORAL
  Filled 2022-07-26: qty 1

## 2022-07-26 MED ORDER — LORAZEPAM 2 MG/ML IJ SOLN
0.5000 mg | Freq: Once | INTRAMUSCULAR | Status: AC
  Administered 2022-07-26: 0.5 mg via INTRAVENOUS
  Filled 2022-07-26: qty 1

## 2022-07-26 MED ORDER — LORAZEPAM 1 MG PO TABS: 1.0000 mg | ORAL_TABLET | Freq: Two times a day (BID) | ORAL | 0 refills | Status: AC | PRN

## 2022-07-26 MED ORDER — MECLIZINE HCL 25 MG PO TABS
25.0000 mg | ORAL_TABLET | Freq: Three times a day (TID) | ORAL | 0 refills | Status: AC | PRN
Start: 2022-07-26 — End: ?

## 2022-07-26 MED ORDER — IOHEXOL 350 MG/ML SOLN
75.0000 mL | Freq: Once | INTRAVENOUS | Status: AC | PRN
  Administered 2022-07-26: 75 mL via INTRAVENOUS

## 2022-07-26 NOTE — ED Notes (Signed)
Pt arrives via [facility] from [facility] as transfer for MRI. Pt has had R side weakness and dizziness x1 week. GCS 15, 110/68, 58HR, 20RR, 95% RA. 20g RAC

## 2022-07-26 NOTE — ED Notes (Signed)
Patient transported to MRI 

## 2022-07-26 NOTE — ED Notes (Signed)
Ativan to be administered at [facility] prior to receiving MRI

## 2022-07-26 NOTE — ED Provider Notes (Signed)
[facility] EMERGENCY DEPARTMENT Provider Note   CSN: [id number] Arrival date & time: 07/26/22  1009     History {Add pertinent medical, surgical, social history, OB history to HPI:1} Chief Complaint  Patient presents with   Dizziness    Amber Barrera is a 46 y.o. female.  HPI     46yo female with history of hyperlipidemia, asthma, migraines, presents with concern for right sided weakness and dizziness.   Has had intermittent dizzy spells for almost 2 weeks (on TUesday) Thursday had episode while walking Prior week had severe episode on Tuesday night, room spinning, fell backwards, was after lying down to sitting then was pulled down onto the bed with severe room spinning.  Then laid down and it went away and then able to get up successfully, felt a little unsteady but ok and when woke up felt ok. Today woke up and felt like right side was weak and uneasy and felt very dizzy, had to hold onto headboard to try to stop from spinning. Not sure if it was when initially opened eyes, then tried to get up and had severe dizziness, right sided weakness.  15-20 minutes felt weak.  Now feel foggy headed Had 2 episodes this morning that lasted for about 10 minutes each, after feels "not myself" No syncope  Each time has been when walking or getting up but has not had it with bending over at work.  Years ago had some dizziness when on prednisone, just one episode of dizziness.  No headaches.  Vision a little blurry at times, difficulty focusing. No double vision or visual field prob. No facial droop, numbness-maybe a little tingling on right hand. Weakness both right arm and leg. No difficulty speaking.   No smoking, other drugs, barely etoh Mother had hx of CVA 19 , unknown fam hx of MS, fathers side had cva but thinks older   No cough, shortness, chest pain, urinary symptoms, fever, conbestion, ear pain.  Had brief fluttering/palpitations early this month.       Past  Medical History:  Diagnosis Date   Abdominal pain    Allergic rhinitis due to pollen    Anemia    iron deficiency- checked last month- iron supplement started   Asthma    no problems - in past 3 yrs -no inhalers used   Change in bowel habits    Depressed mood    Dysmenorrhea    Fatigue    Fibromyalgia 2021   GERD (gastroesophageal reflux disease)    Hypercholesterolemia    Irregular heartbeat    Migraines    Mixed hyperlipidemia    Myalgia    Nausea & vomiting    Pain    Poor sleep    Rectal bleeding    Right leg pain    SOB (shortness of breath)    Vitamin D deficiency    Weakness     Home Medications Prior to Admission medications   Medication Sig Start Date End Date Taking? Authorizing Provider  cyclobenzaprine (FLEXERIL) 10 MG tablet Take 1 tablet (10 mg total) by mouth 3 (three) times daily as needed for muscle spasms. 12/16/20   Jessy Oto, MD  DULoxetine (CYMBALTA) 30 MG capsule Take 30 mg by mouth daily. 10/22/20   [provider]  eszopiclone (LUNESTA) 2 MG TABS tablet Take 2 mg by mouth at bedtime as needed for sleep. Take immediately before bedtime    [provider]  FEROSUL 325 (65 Fe) MG tablet  Take 325 mg by mouth daily. 05/30/20   [provider]  gabapentin (NEURONTIN) 100 MG capsule Take 1 capsule (100 mg total) by mouth 3 (three) times daily. 12/16/20   Jessy Oto, MD  ibuprofen (ADVIL) 600 MG tablet Take 600 mg by mouth every 6 (six) hours as needed. 09/18/20   [provider]  LINZESS 290 MCG CAPS capsule Take 290 mcg by mouth daily. 05/26/20   [provider]  magnesium oxide (MAG-OX) 400 (241.3 Mg) MG tablet Take 1 tablet by mouth daily. 06/24/20   [provider]  meloxicam (MOBIC) 15 MG tablet Take 15 mg by mouth daily. 08/30/20   [provider]  pregabalin (LYRICA) 25 MG capsule Take 25 mg by mouth daily as needed.    [provider]  PROAIR HFA 108 [phone] Base) MCG/ACT inhaler  Inhale into the lungs. 06/19/20   [provider]      Allergies    Latex, Reglan [metoclopramide], and Tape    Review of Systems   Review of Systems  Physical Exam Updated Vital Signs BP 113/74 (BP Location: Left Arm)   Pulse 65   Temp 98.2 F (36.8 C) (Oral)   Resp 17   Ht '5\' 2"'$  (1.575 m)   Wt 80.3 kg   LMP 09/30/2015 Comment: hysterectomy  SpO2 100%   BMI 32.37 kg/m  Physical Exam  ED Results / Procedures / Treatments   Labs (all labs ordered are listed, but only abnormal results are displayed) Labs Reviewed - No data to display  EKG None  Radiology No results found.  Procedures Procedures  {Document cardiac monitor, telemetry assessment procedure when appropriate:1}  Medications Ordered in ED Medications - No data to display  ED Course/ Medical Decision Making/ A&P                           Medical Decision Making Amount and/or Complexity of Data Reviewed Labs: ordered. Radiology: ordered.  Risk Prescription drug management.   46yo female with history of hyperlipidemia, asthma, migraines, presents with concern for right sided weakness and dizziness.     {Document critical care time when appropriate:1} {Document review of labs and clinical decision tools ie heart score, Chads2Vasc2 etc:1}  {Document your independent review of radiology images, and any outside records:1} {Document your discussion with family members, caretakers, and with consultants:1} {Document social determinants of health affecting pt's care:1} {Document your decision making why or why not admission, treatments were needed:1} Final Clinical Impression(s) / ED Diagnoses Final diagnoses:  None    Rx / DC Orders ED Discharge Orders     None

## 2022-07-26 NOTE — ED Provider Notes (Signed)
Pt transferred here from [facility] for MRI.  She still feels a little dizzy, so she is given antivert.  MRI reviewed by me and is nl.  Pt is stable for d/c.  She is to return if worse.  F/u with pcp and with ENT.   Isla Pence, MD 07/26/22 [phone]

## 2022-07-26 NOTE — ED Triage Notes (Signed)
Pt arrives pov, to triage in wheelchair, c/o dizziness and RT side weakness x 1 week. Speech clear, AO x 4, denies HA

## 2022-07-27 NOTE — ED Notes (Signed)
This RN witnessed Mykenzie RN waste 1.'5mg'$  ativan

## 2022-07-27 NOTE — ED Notes (Signed)
RN wasted 1.'5mg'$  ativan w/ Thurston Hole RN.

## 2022-08-23 IMAGING — CT CT RENAL STONE PROTOCOL
2 of 4 series · 16 of 46 positions shown, 18 images · non-contrast
Comparison: 10/04/2016

CLINICAL DATA: Left low back pain and flank pain.



[Series 2: axial st · axial · 0.83mm/px · z∈[+541,+941]mm · 13 of 88 slices shown, 15 images]
[im 4/88  soft-tissue]
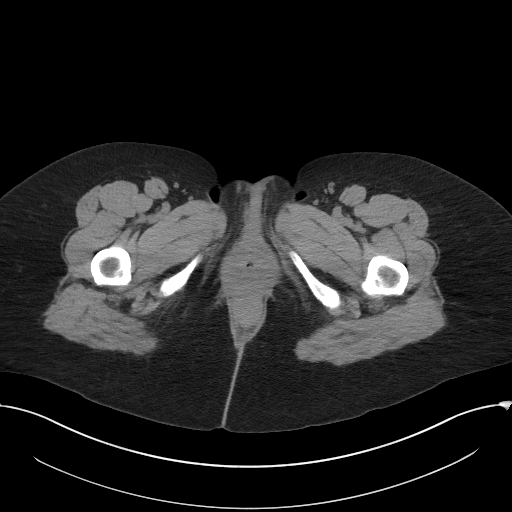
[im 4/88  bone]
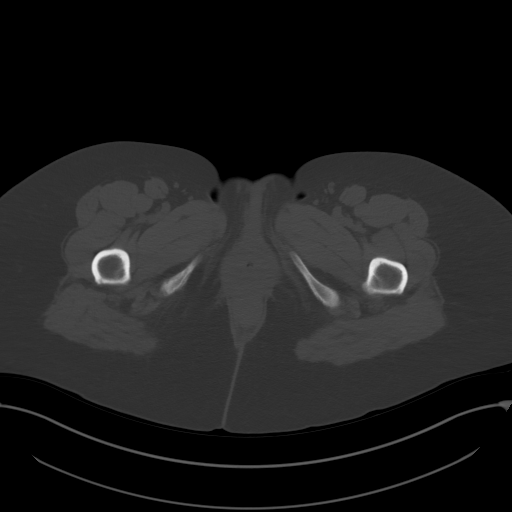
[im 11/88  soft-tissue]
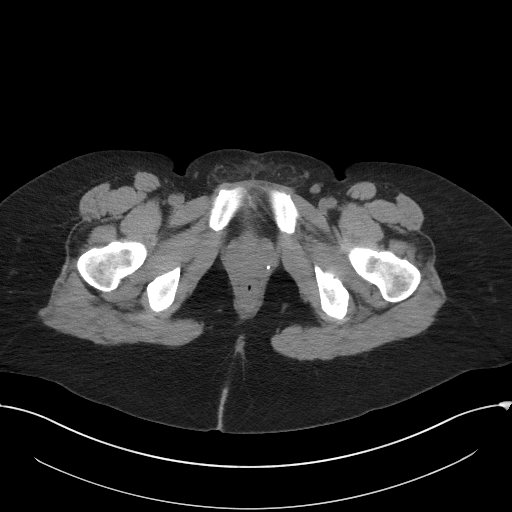
[im 18/88  soft-tissue]
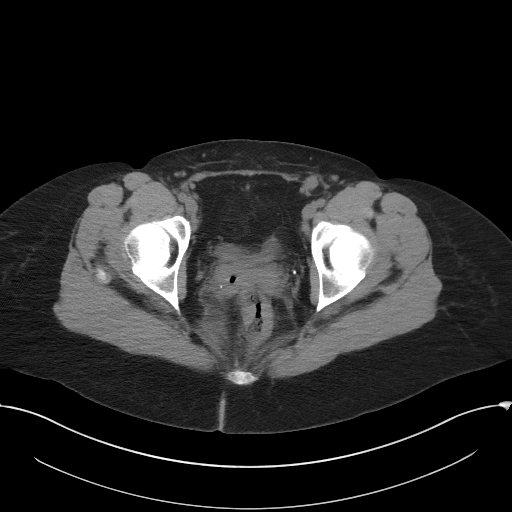
[im 25/88  soft-tissue]
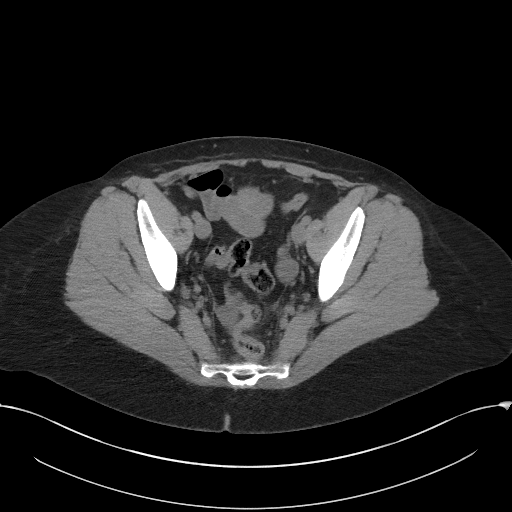
[im 32/88  soft-tissue]
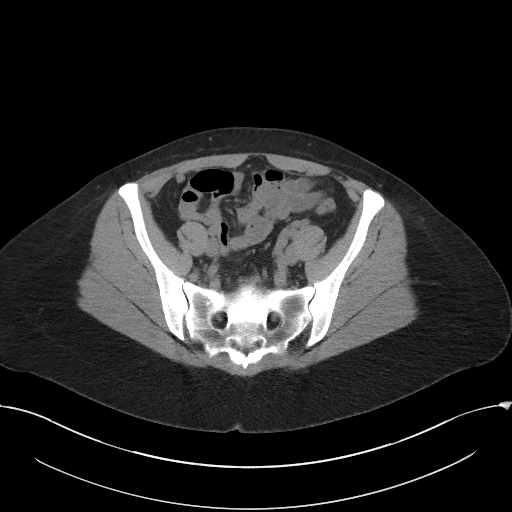
[im 39/88  soft-tissue]
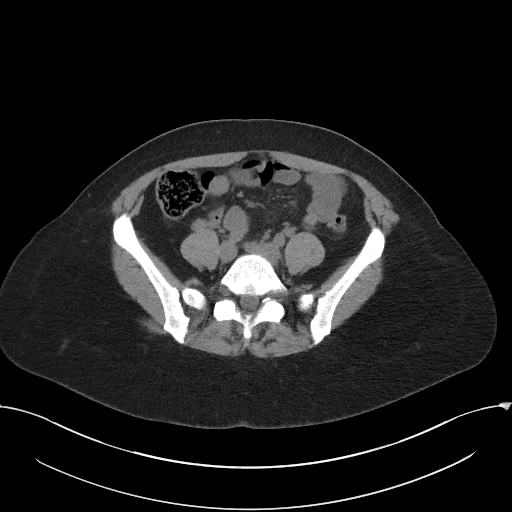
[im 46/88  soft-tissue]
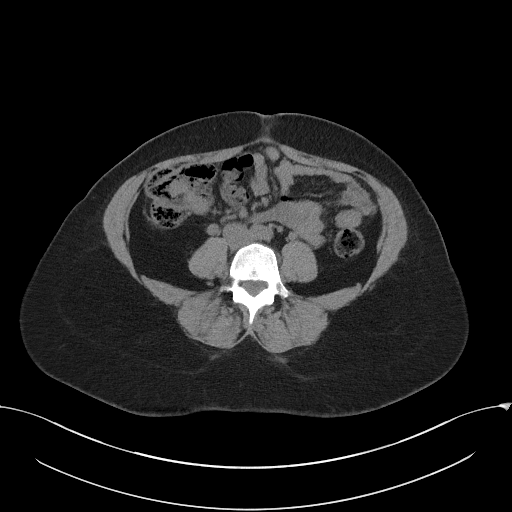
[im 49/88  soft-tissue]
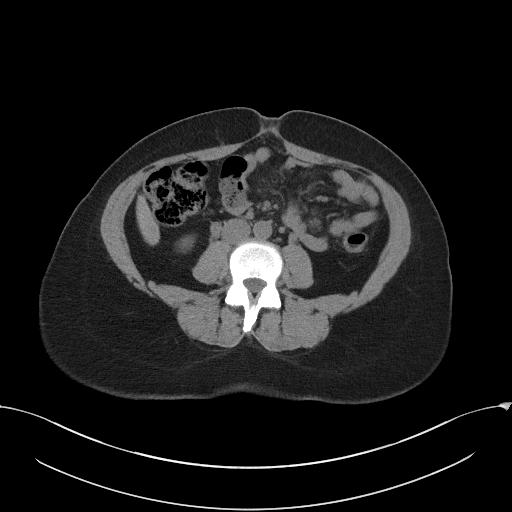
[im 56/88  soft-tissue]
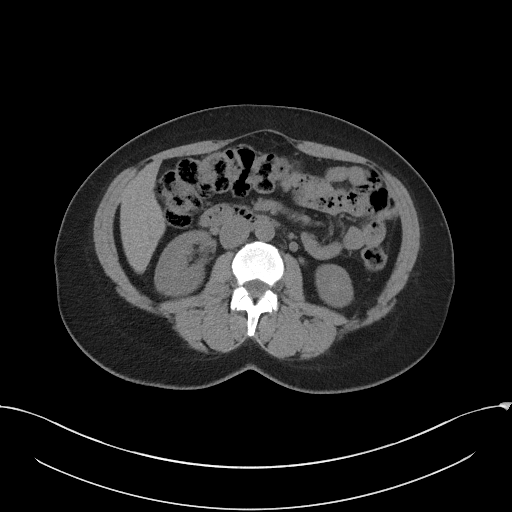
[im 56/88  bone]
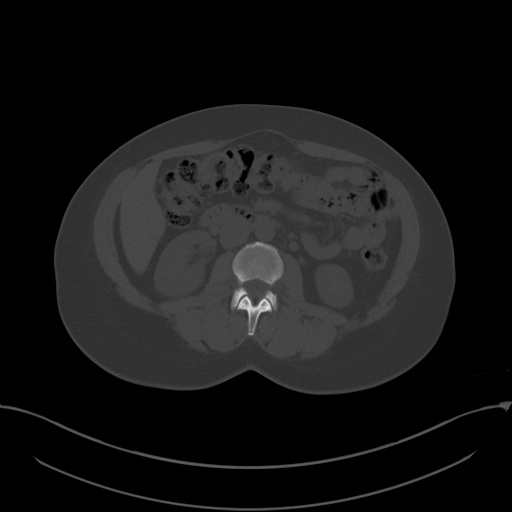
[im 63/88  soft-tissue]
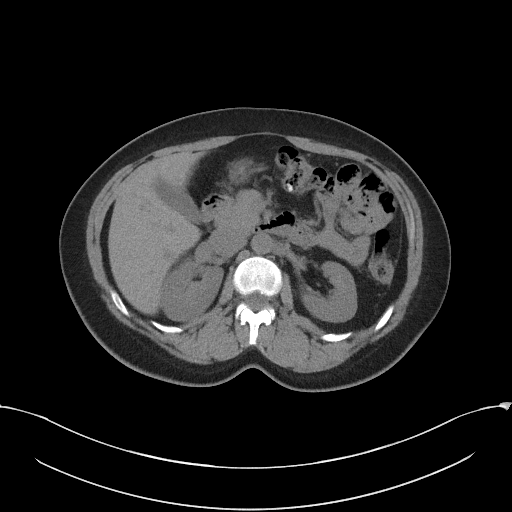
[im 70/88  soft-tissue]
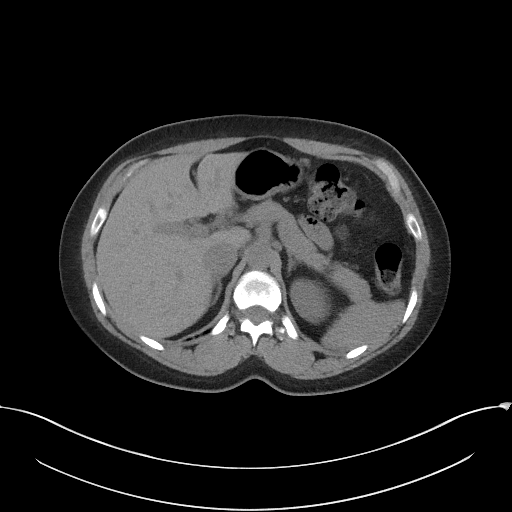
[im 77/88  soft-tissue]
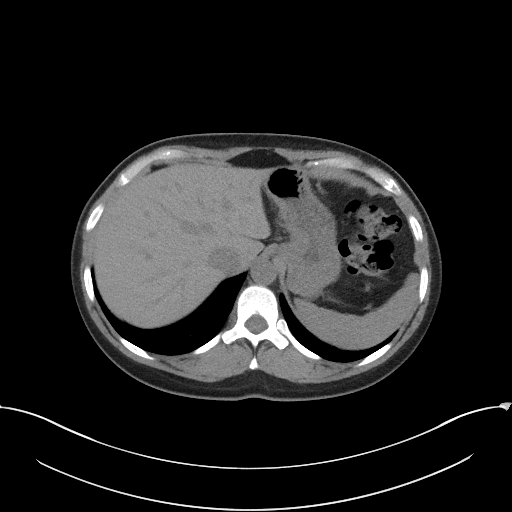
[im 84/88  soft-tissue]
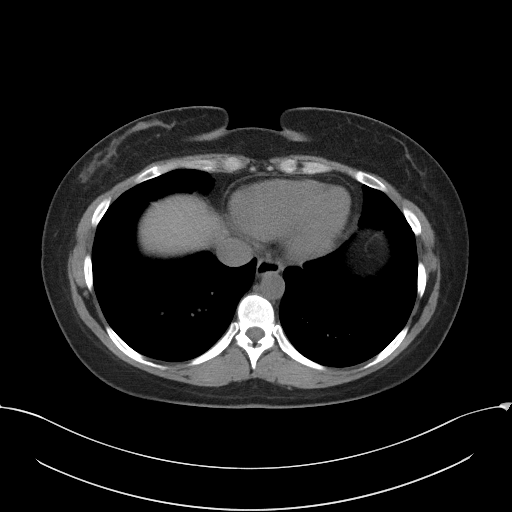

[Series 4: coronal st · coronal · 0.90mm/px · 3 of 93 slices shown]
[im 31/93  soft-tissue]
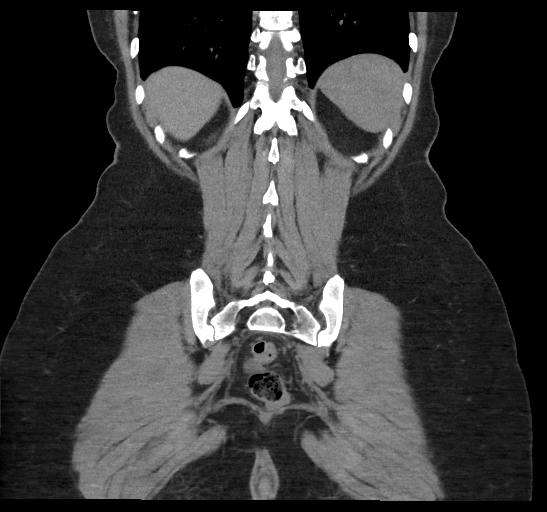
[im 41/93  soft-tissue]
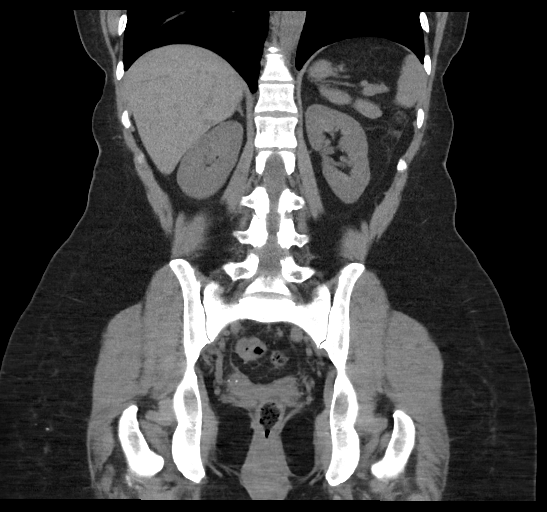
[im 52/93  soft-tissue]
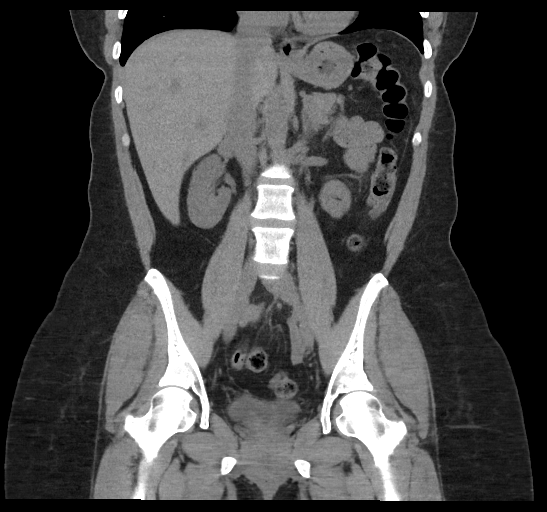

[16 of 46 positions shown; findings below may reference images not displayed]

FINDINGS: Lower chest: Unremarkable

Hepatobiliary: No suspicious focal abnormality in the liver on this
study without intravenous contrast. There is no evidence for
gallstones, gallbladder wall thickening, or pericholecystic fluid.
No intrahepatic or extrahepatic biliary dilation.

Pancreas: No focal mass lesion. No dilatation of the main duct. No
intraparenchymal cyst. No peripancreatic edema.

Spleen: No splenomegaly. No focal mass lesion.

Adrenals/Urinary Tract: No adrenal nodule or mass. Kidneys
unremarkable No evidence for hydroureter. Bladder is decompressed.

Stomach/Bowel: Stomach is unremarkable. No gastric wall thickening.
No evidence of outlet obstruction. Duodenum is normally positioned
as is the ligament of Treitz. No small bowel wall thickening. No
small bowel dilatation. The terminal ileum is normal. The appendix
is normal. No gross colonic mass. No colonic wall thickening.

Vascular/Lymphatic: No abdominal aortic aneurysm. There is no
gastrohepatic or hepatoduodenal ligament lymphadenopathy. No
retroperitoneal or mesenteric lymphadenopathy. No pelvic sidewall
lymphadenopathy.

Reproductive: The uterus is surgically absent. There is no adnexal
mass.

Other: Trace free fluid in the cul-de-sac, likely physiologic.

Musculoskeletal: No worrisome lytic or sclerotic osseous
abnormality.
IMPRESSION: No acute findings in the abdomen or pelvis. Specifically, no
findings to explain the patient's history of left flank pain.

## 2024-06-21 ENCOUNTER — Emergency Department (HOSPITAL_BASED_OUTPATIENT_CLINIC_OR_DEPARTMENT_OTHER)
Admission: EM | Admit: 2024-06-21 | Discharge: 2024-06-22 | Disposition: A | Discharge status: Home / Self Care | Attending: Emergency Medicine | Admitting: Emergency Medicine

## 2024-06-21 ENCOUNTER — Emergency Department (HOSPITAL_BASED_OUTPATIENT_CLINIC_OR_DEPARTMENT_OTHER)

## 2024-06-21 ENCOUNTER — Other Ambulatory Visit: Payer: Self-pay

## 2024-06-21 DIAGNOSIS — S93401A Sprain of unspecified ligament of right ankle, initial encounter: Secondary | ICD-10-CM | POA: Diagnosis not present

## 2024-06-21 DIAGNOSIS — X58XXXA Exposure to other specified factors, initial encounter: Secondary | ICD-10-CM | POA: Diagnosis not present

## 2024-06-21 DIAGNOSIS — S99911A Unspecified injury of right ankle, initial encounter: Secondary | ICD-10-CM | POA: Diagnosis present

## 2024-06-21 DIAGNOSIS — Z91040 Latex allergy status: Secondary | ICD-10-CM | POA: Insufficient documentation

## 2024-06-21 MED ORDER — IBUPROFEN 800 MG PO TABS
800.0000 mg | ORAL_TABLET | Freq: Once | ORAL | Status: AC
  Administered 2024-06-21: 800 mg via ORAL
  Filled 2024-06-21: qty 1

## 2024-06-21 MED ORDER — NAPROXEN 500 MG PO TABS: 500.0000 mg | ORAL_TABLET | Freq: Two times a day (BID) | ORAL | 0 refills | Status: AC

## 2024-06-21 MED ORDER — LIDOCAINE 5 % EX PTCH: 1.0000 | MEDICATED_PATCH | CUTANEOUS | 0 refills | Status: AC

## 2024-06-21 MED ORDER — LIDOCAINE 5 % EX PTCH
1.0000 | MEDICATED_PATCH | CUTANEOUS | Status: DC
  Administered 2024-06-21: 1 via TRANSDERMAL
  Filled 2024-06-21: qty 1

## 2024-06-21 NOTE — ED Provider Notes (Signed)
  EMERGENCY DEPARTMENT AT [facility] Provider Note   CSN: [id number] Arrival date & time: 06/21/24  2143     Patient presents with: Ankle Pain (R)   Amber Barrera is a 48 y.o. female.   The history is provided by the patient.  Ankle Pain Location:  Ankle Time since incident:  1 day Lower extremity injury: does not recall any today.   Pain details:    Quality:  Aching   Radiates to:  Does not radiate   Severity:  Moderate   Onset quality:  Sudden   Timing:  Constant   Progression:  Unchanged Chronicity:  Recurrent (injured in December) Relieved by:  Nothing Worsened by:  Nothing Associated symptoms: no back pain        Prior to Admission medications   Medication Sig Start Date End Date Taking? Authorizing Provider  lidocaine  (LIDODERM ) 5 % Place 1 patch onto the skin daily. Remove & Discard patch within 12 hours or as directed by MD 06/21/24  Yes Dezi Schaner, MD  naproxen  (NAPROSYN ) 500 MG tablet Take 1 tablet (500 mg total) by mouth 2 (two) times daily with a meal. 06/21/24  Yes Kemon Devincenzi, MD  cyclobenzaprine  (FLEXERIL ) 10 MG tablet Take 1 tablet (10 mg total) by mouth 3 (three) times daily as needed for muscle spasms. 12/16/20   Nitka, James E, MD  eszopiclone (LUNESTA) 2 MG TABS tablet Take 2 mg by mouth at bedtime as needed for sleep. Take immediately before bedtime    [provider]  FEROSUL 325 (65 Fe) MG tablet Take 325 mg by mouth daily. 05/30/20   [provider]  LORazepam  (ATIVAN ) 1 MG tablet Take 1 tablet (1 mg total) by mouth 2 (two) times daily as needed (severe dizziness (Take if the antivert  does not help)). 07/26/22   Dean Clarity, MD  magnesium oxide (MAG-OX) 400 (241.3 Mg) MG tablet Take 1 tablet by mouth daily. 06/24/20   [provider]  meclizine  (ANTIVERT ) 25 MG tablet Take 1 tablet (25 mg total) by mouth 3 (three) times daily as needed for dizziness. 07/26/22   Dean Clarity, MD    Allergies:  Latex, Reglan  [metoclopramide ], and Tape    Review of Systems  Respiratory:  Negative for wheezing and stridor.   Gastrointestinal:  Negative for vomiting.  Musculoskeletal:  Positive for arthralgias. Negative for back pain.  All other systems reviewed and are negative.   Updated Vital Signs BP 110/74 (BP Location: Left Arm)   Pulse 75   Temp 97.9 F (36.6 C)   Resp 16   LMP 09/30/2015 Comment: hysterectomy  SpO2 100%   Physical Exam Vitals and nursing note reviewed.  Constitutional:      General: She is not in acute distress.    Appearance: She is well-developed.  HENT:     Head: Normocephalic and atraumatic.  Eyes:     Pupils: Pupils are equal, round, and reactive to light.  Cardiovascular:     Rate and Rhythm: Normal rate and regular rhythm.     Pulses: Normal pulses.     Heart sounds: Normal heart sounds.  Pulmonary:     Effort: Pulmonary effort is normal. No respiratory distress.     Breath sounds: Normal breath sounds.  Abdominal:     General: Bowel sounds are normal. There is no distension.     Palpations: Abdomen is soft.     Tenderness: There is no abdominal tenderness. There is no guarding or rebound.  Musculoskeletal:        General: Normal range of motion.     Cervical back: Neck supple.     Right ankle: No swelling, deformity or ecchymosis. Normal range of motion. Normal pulse.     Right Achilles Tendon: Normal.     Right foot: Normal. No bony tenderness or crepitus. Normal pulse.  Skin:    General: Skin is dry.     Capillary Refill: Capillary refill takes less than 2 seconds.     Findings: No erythema or rash.  Neurological:     General: No focal deficit present.     Deep Tendon Reflexes: Reflexes normal.  Psychiatric:        Mood and Affect: Mood normal.     (all labs ordered are listed, but only abnormal results are displayed) Labs Reviewed - No data to display  EKG: None  Radiology: DG Ankle Complete Right Result Date:  06/21/2024 CLINICAL DATA:  Injury and pain EXAM: RIGHT ANKLE - COMPLETE 3+ VIEW COMPARISON:  None Available. FINDINGS: There is no evidence of fracture, dislocation, or joint effusion. There is no evidence of arthropathy or other focal bone abnormality. There is soft tissue swelling surrounding the ankle. IMPRESSION: 1. No acute fracture or dislocation. 2. Soft tissue swelling surrounding the ankle. Electronically Signed   By: Greig Pique M.D.   On: 06/21/2024 22:15     Procedures   Medications Ordered in the ED  lidocaine  (LIDODERM ) 5 % 1 patch (1 patch Transdermal Patch Applied 06/21/24 2355)  ibuprofen  (ADVIL ) tablet 800 mg (800 mg Oral Given 06/21/24 2354)                                    Medical Decision Making Reactivation of previous ankle injury   Amount and/or Complexity of Data Reviewed External Data Reviewed: notes.    Details: Previous notes reviewed  Radiology: ordered and independent interpretation performed.    Details: No fracture by me  Discussion of management or test interpretation with external provider(s): Well appearing.  Normal exam.  No cords in the RLE.  NSAIDs, lidoderm  ice and elevation.  Stable for discharge.  Strict returns   Risk Prescription drug management.     Final diagnoses:  Sprain of right ankle, unspecified ligament, initial encounter   No signs of systemic illness or infection. The patient is nontoxic-appearing on exam and vital signs are within normal limits.  I have reviewed the triage vital signs and the nursing notes. Pertinent labs & imaging results that were available during my care of the patient were reviewed by me and considered in my medical decision making (see chart for details). After history, exam, and medical workup I feel the patient has been appropriately medically screened and is safe for discharge home. Pertinent diagnoses were discussed with the patient. Patient was given return precautions.      ED Discharge Orders           Ordered    naproxen  (NAPROSYN ) 500 MG tablet  2 times daily with meals        06/21/24 2355    lidocaine  (LIDODERM ) 5 %  Every 24 hours        06/21/24 2355               Dewaine Morocho, MD 06/21/24 2358

## 2024-06-21 NOTE — ED Triage Notes (Signed)
 Pt reports right ankle pain. Sts she's injured it back in December. Was not seen at that time. Was on her feet all day today - worsening pain.

## 2024-06-22 NOTE — ED Notes (Signed)
 RN provided AVS using Teachback Method. Patient verbalizes understanding of Discharge Instructions. Opportunity for Questioning and Answers were provided by RN. Patient Discharged from ED in wheelchair to home.

## 2024-06-29 ENCOUNTER — Other Ambulatory Visit: Payer: Self-pay

## 2024-06-29 ENCOUNTER — Encounter (HOSPITAL_BASED_OUTPATIENT_CLINIC_OR_DEPARTMENT_OTHER): Payer: Self-pay

## 2024-06-29 ENCOUNTER — Emergency Department (HOSPITAL_BASED_OUTPATIENT_CLINIC_OR_DEPARTMENT_OTHER)
Admission: EM | Admit: 2024-06-29 | Discharge: 2024-06-30 | Disposition: A | Discharge status: Home / Self Care | Attending: Emergency Medicine | Admitting: Emergency Medicine

## 2024-06-29 ENCOUNTER — Emergency Department (HOSPITAL_BASED_OUTPATIENT_CLINIC_OR_DEPARTMENT_OTHER): Admitting: Radiology

## 2024-06-29 DIAGNOSIS — R55 Syncope and collapse: Secondary | ICD-10-CM | POA: Insufficient documentation

## 2024-06-29 DIAGNOSIS — Z91040 Latex allergy status: Secondary | ICD-10-CM | POA: Insufficient documentation

## 2024-06-29 DIAGNOSIS — R079 Chest pain, unspecified: Secondary | ICD-10-CM | POA: Diagnosis present

## 2024-06-29 DIAGNOSIS — M79645 Pain in left finger(s): Secondary | ICD-10-CM | POA: Diagnosis not present

## 2024-06-29 LAB — CBC
HCT: 34.1 % — ABNORMAL LOW (ref 36.0–46.0)
Hemoglobin: 10.7 g/dL — ABNORMAL LOW (ref 12.0–15.0)
MCH: 22.2 pg — ABNORMAL LOW (ref 26.0–34.0)
MCHC: 31.4 g/dL (ref 30.0–36.0)
MCV: 70.7 fL — ABNORMAL LOW (ref 80.0–100.0)
Platelets: 217 K/uL (ref 150–400)
RBC: 4.82 MIL/uL (ref 3.87–5.11)
RDW: 14.9 % (ref 11.5–15.5)
WBC: 9 K/uL (ref 4.0–10.5)
nRBC: 0 % (ref 0.0–0.2)

## 2024-06-29 LAB — CBG MONITORING, ED: Glucose-Capillary: 91 mg/dL (ref 70–99)

## 2024-06-29 NOTE — ED Triage Notes (Addendum)
 Arrives POV for syncopal episodes, L arm tingling, chest pain and SOB starting one hour ago. Denies falling or blood sugar issues. BG 91

## 2024-06-30 DIAGNOSIS — R079 Chest pain, unspecified: Secondary | ICD-10-CM | POA: Diagnosis not present

## 2024-06-30 LAB — BASIC METABOLIC PANEL WITH GFR
Anion gap: 10 (ref 5–15)
BUN: 15 mg/dL (ref 6–20)
CO2: 24 mmol/L (ref 22–32)
Calcium: 9.2 mg/dL (ref 8.9–10.3)
Chloride: 103 mmol/L (ref 98–111)
Creatinine, Ser: 0.97 mg/dL (ref 0.44–1.00)
GFR, Estimated: >60 mL/min (ref >60)
Glucose, Bld: 90 mg/dL (ref 70–99)
Potassium: 3.9 mmol/L (ref 3.5–5.1)
Sodium: 137 mmol/L (ref 135–145)

## 2024-06-30 LAB — HEPATIC FUNCTION PANEL
ALT: 9 U/L (ref 0–44)
AST: 13 U/L — ABNORMAL LOW (ref 15–41)
Albumin: 4.1 g/dL (ref 3.5–5.0)
Alkaline Phosphatase: 48 U/L (ref 38–126)
Bilirubin, Direct: 0.3 mg/dL — ABNORMAL HIGH (ref 0.0–0.2)
Indirect Bilirubin: 0.5 mg/dL (ref 0.3–0.9)
Total Bilirubin: 0.7 mg/dL (ref 0.0–1.2)
Total Protein: 7.6 g/dL (ref 6.5–8.1)

## 2024-06-30 LAB — LIPASE, BLOOD: Lipase: 34 U/L (ref 11–51)

## 2024-06-30 LAB — TROPONIN T, HIGH SENSITIVITY
Troponin T High Sensitivity: <15 ng/L (ref <19)
Troponin T High Sensitivity: <15 ng/L (ref <19)

## 2024-06-30 NOTE — ED Provider Notes (Signed)
 [place] [facility]PARTMENT AT [facility] Provider Note   CSN: [id number] Arrival date & time: 06/29/24  2329     Patient presents with: Chest Pain   Amber Barrera is a 48 y.o. female.   48 year old female with history of fibromyalgia no other medical issues, medicines, alcohol, tobacco or drugs who presents ER today with multiple symptoms.  Sounds like she started at work when she was standing there doing some of her normal tasks and felt lightheaded like she might pass out.  She states it last about 2 seconds.  On the counter she did not pass out.  She continued working and felt fine.  When she left work about an hour later she had some type of sharp pain in her left middle finger.  She states that this was pretty severe unlike anything she never had before.  She called her husband and then was able to drive home.  Prior to get in her car she started having some sharp pointed chest pain just below her sternum.  This continued until she got home and she had elevated dyspnea with that as well.  At this time she feels tired but no other symptoms. No nausea, vomiting, diaphoresis or other associated symptoms. No h/o same.    Chest Pain      Prior to Admission medications   Medication Sig Start Date End Date Taking? Authorizing Provider  cyclobenzaprine  (FLEXERIL ) 10 MG tablet Take 1 tablet (10 mg total) by mouth 3 (three) times daily as needed for muscle spasms. 12/16/20   Nitka, James E, MD  eszopiclone (LUNESTA) 2 MG TABS tablet Take 2 mg by mouth at bedtime as needed for sleep. Take immediately before bedtime    [provider]  FEROSUL 325 (65 Fe) MG tablet Take 325 mg by mouth daily. 05/30/20   [provider]  lidocaine  (LIDODERM ) 5 % Place 1 patch onto the skin daily. Remove & Discard patch within 12 hours or as directed by MD 06/21/24   Nettie, April, MD  LORazepam  (ATIVAN ) 1 MG tablet Take 1 tablet (1 mg total) by mouth 2 (two) times daily as needed  (severe dizziness (Take if the antivert  does not help)). 07/26/22   Dean Clarity, MD  magnesium oxide (MAG-OX) 400 (241.3 Mg) MG tablet Take 1 tablet by mouth daily. 06/24/20   [provider]  meclizine  (ANTIVERT ) 25 MG tablet Take 1 tablet (25 mg total) by mouth 3 (three) times daily as needed for dizziness. 07/26/22   Dean Clarity, MD  naproxen  (NAPROSYN ) 500 MG tablet Take 1 tablet (500 mg total) by mouth 2 (two) times daily with a meal. 06/21/24   Palumbo, April, MD    Allergies: Latex, Melatonin, Reglan  [metoclopramide ], and Tape    Review of Systems  Cardiovascular:  Positive for chest pain.    Updated Vital Signs BP 99/69 (BP Location: Left Arm)   Pulse 60   Temp 98 F (36.7 C) (Oral)   Resp (!) 24   Ht 5' 2 (1.575 m)   Wt 79.8 kg   LMP 09/30/2015 Comment: hysterectomy  SpO2 99%   BMI 32.19 kg/m   Physical Exam Vitals and nursing note reviewed.  Constitutional:      Appearance: She is well-developed.  HENT:     Head: Normocephalic and atraumatic.  Cardiovascular:     Rate and Rhythm: Normal rate and regular rhythm.  Pulmonary:     Effort: No respiratory distress.     Breath sounds: No  stridor.  Abdominal:     General: There is no distension.  Musculoskeletal:     Cervical back: Normal range of motion.  Neurological:     General: No focal deficit present.     Mental Status: She is alert.     Comments: No altered mental status, able to give full seemingly accurate history.  Face is symmetric, EOM's intact, pupils equal and reactive, vision intact, tongue and uvula midline without deviation. Upper and Lower extremity motor 5/5, intact pain perception in distal extremities, 2+ reflexes in biceps, patella and achilles tendons. Able to perform finger to nose normal with both hands. Walks without assistance or evident ataxia.       (all labs ordered are listed, but only abnormal results are displayed) Labs Reviewed  CBC - Abnormal; Notable for the  following components:      Result Value   Hemoglobin 10.7 (*)    HCT 34.1 (*)    MCV 70.7 (*)    MCH 22.2 (*)    All other components within normal limits  HEPATIC FUNCTION PANEL - Abnormal; Notable for the following components:   AST 13 (*)    Bilirubin, Direct 0.3 (*)    All other components within normal limits  BASIC METABOLIC PANEL WITH GFR  LIPASE, BLOOD  CBG MONITORING, ED  TROPONIN T, HIGH SENSITIVITY  TROPONIN T, HIGH SENSITIVITY    EKG: None  Radiology: DG Chest 2 View Result Date: 06/30/2024 EXAM: 2 VIEW(S) XRAY OF THE CHEST 06/30/2024 12:03:00 AM COMPARISON: 02/17/2021 CLINICAL HISTORY: Chest pain. Arrives POV for syncopal episodes, L arm tingling, chest pain and SOB starting one hour ago. Denies falling or blood sugar issues. BG 91. FINDINGS: LUNGS AND PLEURA: No focal pulmonary opacity. No pulmonary edema. No pleural effusion. No pneumothorax. HEART AND MEDIASTINUM: No acute abnormality of the cardiac and mediastinal silhouettes. BONES AND SOFT TISSUES: No acute osseous abnormality. IMPRESSION: 1. No acute process. Electronically signed by: Pinkie Pebbles MD 06/30/2024 12:07 AM EDT RP Workstation: [id number]     Procedures   Medications Ordered in the ED - No data to display                                  Medical Decision Making Amount and/or Complexity of Data Reviewed Labs: ordered. Radiology: ordered.   Overall patient appears well. Lab sreassuring. Considered PE but PERC negative. Doubt ACS with nomral troponins and ecg. Lytes ok. Not clear on what caused her symptoms but suggested PCP follow up for recheck and further evaluation. Here if new/worsening symptoms.      Final diagnoses:  Near syncope  Nonspecific chest pain    ED Discharge Orders     None          Kaziah Krizek, Selinda, MD 06/30/24 [phone]
